# Patient Record
Sex: Female | Born: 1950 | Race: White | Hispanic: No | Marital: Married | Smoking: Never smoker
Health system: Southern US, Community
[De-identification: ages and names within clinical notes are randomized; demographics above are authoritative.]

## PROBLEM LIST (undated history)

## (undated) DIAGNOSIS — F419 Anxiety disorder, unspecified: Secondary | ICD-10-CM

## (undated) DIAGNOSIS — I10 Essential (primary) hypertension: Secondary | ICD-10-CM

## (undated) DIAGNOSIS — K219 Gastro-esophageal reflux disease without esophagitis: Secondary | ICD-10-CM

## (undated) DIAGNOSIS — N182 Chronic kidney disease, stage 2 (mild): Secondary | ICD-10-CM

## (undated) HISTORY — PX: CERVICAL FUSION: SHX112

---

## 2021-05-20 ENCOUNTER — Ambulatory Visit
Admission: EM | Admit: 2021-05-20 | Discharge: 2021-05-20 | Disposition: A | Discharge status: Home / Self Care | Payer: Medicare Other

## 2021-05-20 ENCOUNTER — Other Ambulatory Visit: Payer: Self-pay

## 2021-05-20 DIAGNOSIS — J0100 Acute maxillary sinusitis, unspecified: Secondary | ICD-10-CM | POA: Diagnosis not present

## 2021-05-20 HISTORY — DX: Essential (primary) hypertension: I10

## 2021-05-20 MED ORDER — IPRATROPIUM BROMIDE 0.06 % NA SOLN: 2.0000 | Freq: Four times a day (QID) | NASAL | 12 refills | Status: AC

## 2021-05-20 MED ORDER — AMOXICILLIN-POT CLAVULANATE 875-125 MG PO TABS: 1.0000 | ORAL_TABLET | Freq: Two times a day (BID) | ORAL | 0 refills | Status: AC

## 2021-05-20 MED ORDER — PREDNISONE 10 MG (21) PO TBPK: ORAL_TABLET | ORAL | 0 refills | Status: DC

## 2021-05-20 NOTE — Discharge Instructions (Signed)
The Augmentin twice daily with food for 14 days for treatment of your sinusitis.  Perform sinus irrigation 2-3 times a day with a NeilMed sinus rinse kit and distilled water.  Do not use tap water.  You can use plain over-the-counter Mucinex every 6 hours to break up the stickiness of the mucus so your body can clear it.  Increase your oral fluid intake to thin out your mucus so that is also able for your body to clear more easily.  Take an over-the-counter probiotic, such as Culturelle-align-activia, 1 hour after each dose of antibiotic to prevent diarrhea.  Take the Prednisone pack as directed by the package.  Use the Atrovent nasal spray, 2 squirts in each nostril every 6 hours as needed for nasal congestion.  If you develop any new or worsening symptoms return for reevaluation or see your primary care provider.

## 2021-05-20 NOTE — ED Triage Notes (Signed)
Pt c/o sinus congestion, facial pain for about 4 weeks. Pt has been treated with amoxicillin and flonase, when symptoms first started. Pt reports she also had an ear infection in her right ear. Pt states she did not improve with meds. Pt denies f/n/v/d or other symptoms.

## 2021-05-20 NOTE — ED Provider Notes (Signed)
[facility]    CSN: [id number] Arrival date & time: 05/20/21  1010      History   Chief Complaint Chief Complaint  Patient presents with   Nasal Congestion    HPI Shelia Wallace is a 70 y.o. female.   HPI  70 year old female here for evaluation of sinus congestion and facial pain.  Patient reports that she has been experiencing the above mentioned symptoms for the past 4 weeks.  [facility] [place] and was treated with amoxicillin and Flonase which did not resolve her symptoms.  She states that at that time she also had an ear infection right ear.  She states that she is continuing to feel pain in the right ear that goes down her jaw.  She states that the pain is most significant when she lays down at night and it makes it difficult for her to sleep.  She has not had a fever, pain in her upper teeth, or drainage from the ears.  She states that the pain is mostly in her maxillary sinuses, right ear, and down the right side of her neck.  She is experiencing postnasal drip and hoarseness.  She states that initially she had some nasal discharge but now she is not getting anything out of her nose.  In addition to the pain in her right ear she is also experiencing ringing in both ears.  This is not a new symptom.  Past Medical History:  Diagnosis Date   Hypertension     There are no problems to display for this patient.   History reviewed. No pertinent surgical history.  OB History   No obstetric history on file.      Home Medications    Prior to Admission medications   Medication Sig Start Date End Date Taking? Authorizing Provider  ADVAIR DISKUS 100-50 MCG/ACT AEPB SMARTSIG:1 unspecified By Mouth Twice Daily 04/30/21  Yes [provider]  albuterol (VENTOLIN HFA) 108 (90 Base) MCG/ACT inhaler Inhale into the lungs every 6 (six) hours as needed for wheezing or shortness of breath.   Yes [provider]   amoxicillin-clavulanate (AUGMENTIN) 875-125 MG tablet Take 1 tablet by mouth every 12 (twelve) hours for 14 days. 05/20/21 06/03/21 Yes Margarette Canada, NP  carvedilol (COREG) 6.25 MG tablet Take 6.25 mg by mouth 2 (two) times daily. 05/17/21  Yes [provider]  gabapentin (NEURONTIN) 300 MG capsule Take 300 mg by mouth every 12 (twelve) hours. 12/27/20  Yes [provider]  ipratropium (ATROVENT) 0.06 % nasal spray Place 2 sprays into both nostrils 4 (four) times daily. 05/20/21  Yes Margarette Canada, NP  losartan (COZAAR) 100 MG tablet Take 100 mg by mouth daily. 05/17/21  Yes [provider]  predniSONE (STERAPRED UNI-PAK 21 TAB) 10 MG (21) TBPK tablet Take 6 tablets on day 1, 5 tablets day 2, 4 tablets day 3, 3 tablets day 4, 2 tablets day 5, 1 tablet day 6 05/20/21  Yes Margarette Canada, NP    Family History History reviewed. No pertinent family history.  Social History Social History   Tobacco Use   Smoking status: Never   Smokeless tobacco: Never  Vaping Use   Vaping Use: Never used  Substance Use Topics   Alcohol use: Not Currently   Drug use: Never     Allergies   Patient has no known allergies.   Review of Systems Review of Systems  Constitutional:  Negative for activity change, appetite change  and fever.  HENT:  Positive for ear pain, sinus pressure and sinus pain. Negative for rhinorrhea and sore throat.   Respiratory:  Positive for cough. Negative for shortness of breath and wheezing.   Hematological: Negative.   Psychiatric/Behavioral: Negative.      Physical Exam Triage Vital Signs ED Triage Vitals [05/20/21 1144]  Enc Vitals Group     BP      Pulse      Resp      Temp      Temp src      SpO2      Weight 167 lb (75.8 kg)     Height 5' 6"  (1.676 m)     Head Circumference      Peak Flow      Pain Score 0     Pain Loc      Pain Edu?      Excl. in [place]?    No data found.  Updated Vital Signs BP (!) 156/76 (BP Location: Left Arm)    Pulse (!) 55   Temp 98.1 F (36.7 C) (Oral)   Resp 18   Ht 5' 6"  (1.676 m)   Wt 167 lb (75.8 kg)   SpO2 96%   BMI 26.95 kg/m   Visual Acuity Right Eye Distance:   Left Eye Distance:   Bilateral Distance:    Right Eye Near:   Left Eye Near:    Bilateral Near:     Physical Exam Vitals and nursing note reviewed.  Constitutional:      General: She is not in acute distress.    Appearance: Normal appearance. She is not ill-appearing.  HENT:     Head: Normocephalic and atraumatic.     Right Ear: Tympanic membrane, ear canal and external ear normal. There is no impacted cerumen.     Left Ear: Tympanic membrane, ear canal and external ear normal. There is no impacted cerumen.     Ears:     Comments: Patient does have pain with palpation of bilateral eustachian tubes externally.    Nose: Congestion and rhinorrhea present.     Comments: This mucosa is edematous with a bluish hue.  There is purulent discharge in both nares.  Patient does have tenderness to percussion of maxillary sinuses but not to frontal sinuses bilaterally.    Mouth/Throat:     Mouth: Mucous membranes are moist.     Pharynx: Oropharynx is clear. No posterior oropharyngeal erythema.  Cardiovascular:     Rate and Rhythm: Normal rate and regular rhythm.     Pulses: Normal pulses.     Heart sounds: Normal heart sounds. No murmur heard.   No gallop.  Pulmonary:     Effort: Pulmonary effort is normal.     Breath sounds: Normal breath sounds. No wheezing, rhonchi or rales.  Musculoskeletal:     Cervical back: Normal range of motion and neck supple.  Lymphadenopathy:     Cervical: Cervical adenopathy present.  Skin:    General: Skin is warm and dry.     Capillary Refill: Capillary refill takes less than 2 seconds.     Findings: No erythema or rash.  Neurological:     General: No focal deficit present.     Mental Status: She is alert and oriented to person, place, and time.  Psychiatric:        Mood and Affect:  Mood normal.        Behavior: Behavior normal.  Thought Content: Thought content normal.        Judgment: Judgment normal.     UC Treatments / Results  Labs (all labs ordered are listed, but only abnormal results are displayed) Labs Reviewed - No data to display  EKG   Radiology No results found.  Procedures Procedures (including critical care time)  Medications Ordered in UC Medications - No data to display  Initial Impression / Assessment and Plan / UC Course  I have reviewed the triage vital signs and the nursing notes.  Pertinent labs & imaging results that were available during my care of the patient were reviewed by me and considered in my medical decision making (see chart for details).  Is a nontoxic-appearing 70 year old female here for evaluation of 4 weeks worth of maxillary sinus pain, right ear pain, and pain on the right side of her neck.  She was previously treated with amoxicillin and Flonase without resolution of the symptoms.  She states that she is still using her Flonase but it does not seem to be helping.  Patient's physical exam reveals pearly gray tympanic membranes bilaterally with a normal light reflex and clear external auditory canals.  Both eustachian tubes are tender to palpation externally.  Nasal mucosa is markedly edematous and pale with a bluish hue.  There is purulent discharge in both nares.  Patient does have tenderness to percussion of bilateral maxillary sinuses.  Frontal sinuses are benign.  Oropharyngeal exam is benign.  Patient does have bilateral anterior cervical lymphadenopathy.  Cardiopulmonary exam reveals clear lung sounds in all fields.  Suspect patient has had nonresolution of maxillary sinusitis following her initial treatment.  We will place patient on Augmentin twice daily for 14 days.  I have also prescribed the patient a prednisone pack to help decrease inflammation and Atrovent nasal spray to help with nasal inflammation and  postnasal drip.  The patient and I discussed sinus irrigation using a NeilMed sinus rinse kit and distilled water as this may help evacuate the mucus that is retained within her maxillary sinuses.  I advised the patient that if her symptoms do not resolve after second round of antibiotics she needs to see an ear nose and throat specialist.   Final Clinical Impressions(s) / UC Diagnoses   Final diagnoses:  Acute non-recurrent maxillary sinusitis     Discharge Instructions      The Augmentin twice daily with food for 14 days for treatment of your sinusitis.  Perform sinus irrigation 2-3 times a day with a NeilMed sinus rinse kit and distilled water.  Do not use tap water.  You can use plain over-the-counter Mucinex every 6 hours to break up the stickiness of the mucus so your body can clear it.  Increase your oral fluid intake to thin out your mucus so that is also able for your body to clear more easily.  Take an over-the-counter probiotic, such as Culturelle-align-activia, 1 hour after each dose of antibiotic to prevent diarrhea.  Take the Prednisone pack as directed by the package.  Use the Atrovent nasal spray, 2 squirts in each nostril every 6 hours as needed for nasal congestion.  If you develop any new or worsening symptoms return for reevaluation or see your primary care provider.      ED Prescriptions     Medication Sig Dispense Auth. Provider   amoxicillin-clavulanate (AUGMENTIN) 875-125 MG tablet Take 1 tablet by mouth every 12 (twelve) hours for 14 days. 28 tablet Margarette Canada, NP   ipratropium (  ATROVENT) 0.06 % nasal spray Place 2 sprays into both nostrils 4 (four) times daily. 15 mL Margarette Canada, NP   predniSONE (STERAPRED UNI-PAK 21 TAB) 10 MG (21) TBPK tablet Take 6 tablets on day 1, 5 tablets day 2, 4 tablets day 3, 3 tablets day 4, 2 tablets day 5, 1 tablet day 6 21 tablet Margarette Canada, NP      PDMP not reviewed this encounter.   Margarette Canada, NP 05/20/21  1208

## 2021-09-01 ENCOUNTER — Other Ambulatory Visit: Payer: Self-pay

## 2021-09-01 ENCOUNTER — Emergency Department: Payer: Medicare Other

## 2021-09-01 ENCOUNTER — Encounter: Payer: Self-pay | Admitting: Emergency Medicine

## 2021-09-01 DIAGNOSIS — Z981 Arthrodesis status: Secondary | ICD-10-CM

## 2021-09-01 DIAGNOSIS — Z87891 Personal history of nicotine dependence: Secondary | ICD-10-CM

## 2021-09-01 DIAGNOSIS — K219 Gastro-esophageal reflux disease without esophagitis: Secondary | ICD-10-CM | POA: Diagnosis present

## 2021-09-01 DIAGNOSIS — Z801 Family history of malignant neoplasm of trachea, bronchus and lung: Secondary | ICD-10-CM

## 2021-09-01 DIAGNOSIS — F419 Anxiety disorder, unspecified: Secondary | ICD-10-CM | POA: Diagnosis present

## 2021-09-01 DIAGNOSIS — R042 Hemoptysis: Secondary | ICD-10-CM | POA: Diagnosis present

## 2021-09-01 DIAGNOSIS — Z79899 Other long term (current) drug therapy: Secondary | ICD-10-CM

## 2021-09-01 DIAGNOSIS — R7989 Other specified abnormal findings of blood chemistry: Secondary | ICD-10-CM | POA: Diagnosis not present

## 2021-09-01 DIAGNOSIS — J9601 Acute respiratory failure with hypoxia: Secondary | ICD-10-CM | POA: Diagnosis present

## 2021-09-01 DIAGNOSIS — Z8249 Family history of ischemic heart disease and other diseases of the circulatory system: Secondary | ICD-10-CM

## 2021-09-01 DIAGNOSIS — U071 COVID-19: Secondary | ICD-10-CM | POA: Diagnosis not present

## 2021-09-01 DIAGNOSIS — Z7951 Long term (current) use of inhaled steroids: Secondary | ICD-10-CM

## 2021-09-01 DIAGNOSIS — D539 Nutritional anemia, unspecified: Secondary | ICD-10-CM | POA: Diagnosis present

## 2021-09-01 DIAGNOSIS — N182 Chronic kidney disease, stage 2 (mild): Secondary | ICD-10-CM | POA: Diagnosis present

## 2021-09-01 DIAGNOSIS — R918 Other nonspecific abnormal finding of lung field: Secondary | ICD-10-CM | POA: Diagnosis present

## 2021-09-01 DIAGNOSIS — Z28310 Unvaccinated for covid-19: Secondary | ICD-10-CM

## 2021-09-01 DIAGNOSIS — I129 Hypertensive chronic kidney disease with stage 1 through stage 4 chronic kidney disease, or unspecified chronic kidney disease: Secondary | ICD-10-CM | POA: Diagnosis present

## 2021-09-01 LAB — RESP PANEL BY RT-PCR (FLU A&B, COVID) ARPGX2
Influenza A by PCR: NEGATIVE
Influenza B by PCR: NEGATIVE
SARS Coronavirus 2 by RT PCR: POSITIVE — AB

## 2021-09-01 LAB — BASIC METABOLIC PANEL WITH GFR
Anion gap: 5 (ref 5–15)
BUN: 18 mg/dL (ref 8–23)
CO2: 24 mmol/L (ref 22–32)
Calcium: 8.7 mg/dL — ABNORMAL LOW (ref 8.9–10.3)
Chloride: 102 mmol/L (ref 98–111)
Creatinine, Ser: 1.1 mg/dL — ABNORMAL HIGH (ref 0.44–1.00)
GFR, Estimated: 54 mL/min — ABNORMAL LOW
Glucose, Bld: 126 mg/dL — ABNORMAL HIGH (ref 70–99)
Potassium: 4.1 mmol/L (ref 3.5–5.1)
Sodium: 131 mmol/L — ABNORMAL LOW (ref 135–145)

## 2021-09-01 LAB — CBC
HCT: 29.1 % — ABNORMAL LOW (ref 36.0–46.0)
Hemoglobin: 9.6 g/dL — ABNORMAL LOW (ref 12.0–15.0)
MCH: 33.3 pg (ref 26.0–34.0)
MCHC: 33 g/dL (ref 30.0–36.0)
MCV: 101 fL — ABNORMAL HIGH (ref 80.0–100.0)
Platelets: 243 K/uL (ref 150–400)
RBC: 2.88 MIL/uL — ABNORMAL LOW (ref 3.87–5.11)
RDW: 13.5 % (ref 11.5–15.5)
WBC: 7.5 K/uL (ref 4.0–10.5)
nRBC: 0 % (ref 0.0–0.2)

## 2021-09-01 LAB — TROPONIN I (HIGH SENSITIVITY): Troponin I (High Sensitivity): 5 ng/L (ref <18)

## 2021-09-01 LAB — D-DIMER, QUANTITATIVE: D-Dimer, Quant: 0.76 ug/mL-FEU — ABNORMAL HIGH (ref 0.00–0.50)

## 2021-09-01 NOTE — ED Triage Notes (Signed)
Pt presents to ER c/o chest pain that radiates to back, sob and hematemesis.  Pt states she has been feeling sob since Thursday last week and today it became increasingly worse.  Pt states she has had pneumonia in past and this feels similar.  Pt A&O x4.  Per EDT Misty Stanley, pt was 84% on RA with sats improving on 2L.  Pt does not wear O2 at home.  Pt appears weak in triage.

## 2021-09-02 ENCOUNTER — Emergency Department: Payer: Medicare Other

## 2021-09-02 ENCOUNTER — Inpatient Hospital Stay
Admission: EM | Admit: 2021-09-02 | Discharge: 2021-09-04 | DRG: 177 | Disposition: A | Discharge status: Home / Self Care | Payer: Medicare Other | Attending: Internal Medicine | Admitting: Internal Medicine

## 2021-09-02 ENCOUNTER — Encounter: Payer: Self-pay | Admitting: Internal Medicine

## 2021-09-02 ENCOUNTER — Inpatient Hospital Stay: Payer: Medicare Other

## 2021-09-02 DIAGNOSIS — D649 Anemia, unspecified: Secondary | ICD-10-CM | POA: Diagnosis present

## 2021-09-02 DIAGNOSIS — D539 Nutritional anemia, unspecified: Secondary | ICD-10-CM | POA: Diagnosis present

## 2021-09-02 DIAGNOSIS — R7989 Other specified abnormal findings of blood chemistry: Secondary | ICD-10-CM

## 2021-09-02 DIAGNOSIS — R911 Solitary pulmonary nodule: Secondary | ICD-10-CM | POA: Diagnosis not present

## 2021-09-02 DIAGNOSIS — Z87891 Personal history of nicotine dependence: Secondary | ICD-10-CM | POA: Diagnosis not present

## 2021-09-02 DIAGNOSIS — I129 Hypertensive chronic kidney disease with stage 1 through stage 4 chronic kidney disease, or unspecified chronic kidney disease: Secondary | ICD-10-CM | POA: Diagnosis present

## 2021-09-02 DIAGNOSIS — I10 Essential (primary) hypertension: Secondary | ICD-10-CM | POA: Diagnosis not present

## 2021-09-02 DIAGNOSIS — F419 Anxiety disorder, unspecified: Secondary | ICD-10-CM

## 2021-09-02 DIAGNOSIS — Z28310 Unvaccinated for covid-19: Secondary | ICD-10-CM | POA: Diagnosis not present

## 2021-09-02 DIAGNOSIS — Z8249 Family history of ischemic heart disease and other diseases of the circulatory system: Secondary | ICD-10-CM | POA: Diagnosis not present

## 2021-09-02 DIAGNOSIS — N179 Acute kidney failure, unspecified: Secondary | ICD-10-CM

## 2021-09-02 DIAGNOSIS — K219 Gastro-esophageal reflux disease without esophagitis: Secondary | ICD-10-CM | POA: Diagnosis present

## 2021-09-02 DIAGNOSIS — U071 COVID-19: Principal | ICD-10-CM

## 2021-09-02 DIAGNOSIS — J9601 Acute respiratory failure with hypoxia: Secondary | ICD-10-CM | POA: Diagnosis present

## 2021-09-02 DIAGNOSIS — N182 Chronic kidney disease, stage 2 (mild): Secondary | ICD-10-CM | POA: Diagnosis present

## 2021-09-02 DIAGNOSIS — Z7951 Long term (current) use of inhaled steroids: Secondary | ICD-10-CM | POA: Diagnosis not present

## 2021-09-02 DIAGNOSIS — R918 Other nonspecific abnormal finding of lung field: Secondary | ICD-10-CM | POA: Diagnosis present

## 2021-09-02 DIAGNOSIS — Z981 Arthrodesis status: Secondary | ICD-10-CM | POA: Diagnosis not present

## 2021-09-02 DIAGNOSIS — Z79899 Other long term (current) drug therapy: Secondary | ICD-10-CM | POA: Diagnosis not present

## 2021-09-02 DIAGNOSIS — Z801 Family history of malignant neoplasm of trachea, bronchus and lung: Secondary | ICD-10-CM | POA: Diagnosis not present

## 2021-09-02 DIAGNOSIS — R042 Hemoptysis: Secondary | ICD-10-CM | POA: Diagnosis present

## 2021-09-02 HISTORY — DX: Gastro-esophageal reflux disease without esophagitis: K21.9

## 2021-09-02 HISTORY — DX: Chronic kidney disease, stage 2 (mild): N18.2

## 2021-09-02 HISTORY — DX: Anxiety disorder, unspecified: F41.9

## 2021-09-02 LAB — RETICULOCYTES
Immature Retic Fract: 21.9 % — ABNORMAL HIGH (ref 2.3–15.9)
RBC.: 2.97 MIL/uL — ABNORMAL LOW (ref 3.87–5.11)
Retic Count, Absolute: 140.5 10*3/uL (ref 19.0–186.0)
Retic Ct Pct: 4.7 % — ABNORMAL HIGH (ref 0.4–3.1)

## 2021-09-02 LAB — IRON AND TIBC
Iron: 27 ug/dL — ABNORMAL LOW (ref 28–170)
Saturation Ratios: 8 % — ABNORMAL LOW (ref 10.4–31.8)
TIBC: 332 ug/dL (ref 250–450)
UIBC: 305 ug/dL

## 2021-09-02 LAB — FERRITIN: Ferritin: 167 ng/mL (ref 11–307)

## 2021-09-02 LAB — TROPONIN I (HIGH SENSITIVITY): Troponin I (High Sensitivity): 6 ng/L (ref <18)

## 2021-09-02 LAB — C-REACTIVE PROTEIN: CRP: 1.7 mg/dL — ABNORMAL HIGH (ref <1.0)

## 2021-09-02 LAB — FOLATE: Folate: 9.8 ng/mL (ref >5.9)

## 2021-09-02 LAB — VITAMIN B12: Vitamin B-12: 1170 pg/mL — ABNORMAL HIGH (ref 180–914)

## 2021-09-02 MED ORDER — B COMPLEX-C PO TABS
1.0000 | ORAL_TABLET | Freq: Every day | ORAL | Status: DC
  Administered 2021-09-03 – 2021-09-04 (×2): 1 via ORAL
  Filled 2021-09-02 (×2): qty 1

## 2021-09-02 MED ORDER — ACETAMINOPHEN 325 MG PO TABS: 650.0000 mg | ORAL_TABLET | Freq: Four times a day (QID) | ORAL | Status: DC | PRN

## 2021-09-02 MED ORDER — LACTATED RINGERS IV BOLUS: 1000.0000 mL | Freq: Once | INTRAVENOUS | Status: DC

## 2021-09-02 MED ORDER — METHYLPREDNISOLONE SODIUM SUCC 40 MG IJ SOLR
40.0000 mg | Freq: Two times a day (BID) | INTRAMUSCULAR | Status: DC
  Administered 2021-09-02 – 2021-09-04 (×4): 40 mg via INTRAVENOUS
  Filled 2021-09-02 (×4): qty 1

## 2021-09-02 MED ORDER — IPRATROPIUM-ALBUTEROL 0.5-2.5 (3) MG/3ML IN SOLN
3.0000 mL | Freq: Once | RESPIRATORY_TRACT | Status: AC
  Administered 2021-09-02: 3 mL via RESPIRATORY_TRACT
  Filled 2021-09-02: qty 3

## 2021-09-02 MED ORDER — ALPRAZOLAM 0.25 MG PO TABS
0.2500 mg | ORAL_TABLET | Freq: Every day | ORAL | Status: DC
  Administered 2021-09-04: 0.25 mg via ORAL
  Filled 2021-09-02 (×3): qty 1

## 2021-09-02 MED ORDER — LOSARTAN POTASSIUM 50 MG PO TABS
100.0000 mg | ORAL_TABLET | Freq: Every day | ORAL | Status: DC
  Administered 2021-09-02 – 2021-09-04 (×3): 100 mg via ORAL
  Filled 2021-09-02 (×3): qty 2

## 2021-09-02 MED ORDER — DAPSONE 100 MG PO TABS
100.0000 mg | ORAL_TABLET | Freq: Every day | ORAL | Status: DC
  Administered 2021-09-02 – 2021-09-04 (×3): 100 mg via ORAL
  Filled 2021-09-02 (×3): qty 1

## 2021-09-02 MED ORDER — IPRATROPIUM-ALBUTEROL 0.5-2.5 (3) MG/3ML IN SOLN: 3.0000 mL | Freq: Once | RESPIRATORY_TRACT | Status: DC

## 2021-09-02 MED ORDER — FAMOTIDINE 20 MG PO TABS
20.0000 mg | ORAL_TABLET | Freq: Two times a day (BID) | ORAL | Status: DC
  Administered 2021-09-02 – 2021-09-04 (×4): 20 mg via ORAL
  Filled 2021-09-02 (×4): qty 1

## 2021-09-02 MED ORDER — ONDANSETRON HCL 4 MG/2ML IJ SOLN: 4.0000 mg | Freq: Three times a day (TID) | INTRAMUSCULAR | Status: DC | PRN

## 2021-09-02 MED ORDER — DM-GUAIFENESIN ER 30-600 MG PO TB12
1.0000 | ORAL_TABLET | Freq: Two times a day (BID) | ORAL | Status: DC | PRN
  Administered 2021-09-02 – 2021-09-04 (×3): 1 via ORAL
  Filled 2021-09-02 (×4): qty 1

## 2021-09-02 MED ORDER — IOHEXOL 350 MG/ML SOLN
75.0000 mL | Freq: Once | INTRAVENOUS | Status: AC | PRN
  Administered 2021-09-02: 75 mL via INTRAVENOUS
  Filled 2021-09-02: qty 75

## 2021-09-02 MED ORDER — METHYLPREDNISOLONE SODIUM SUCC 125 MG IJ SOLR
125.0000 mg | Freq: Once | INTRAMUSCULAR | Status: AC
  Administered 2021-09-02: 125 mg via INTRAVENOUS
  Filled 2021-09-02: qty 2

## 2021-09-02 MED ORDER — SODIUM CHLORIDE 0.9 % IV SOLN
100.0000 mg | Freq: Every day | INTRAVENOUS | Status: DC
  Administered 2021-09-03 – 2021-09-04 (×2): 100 mg via INTRAVENOUS
  Filled 2021-09-02: qty 100
  Filled 2021-09-02: qty 20

## 2021-09-02 MED ORDER — CARVEDILOL 6.25 MG PO TABS
6.2500 mg | ORAL_TABLET | Freq: Two times a day (BID) | ORAL | Status: DC
  Administered 2021-09-02 – 2021-09-04 (×4): 6.25 mg via ORAL
  Filled 2021-09-02 (×4): qty 1

## 2021-09-02 MED ORDER — SODIUM CHLORIDE 0.9 % IV SOLN
200.0000 mg | Freq: Once | INTRAVENOUS | Status: AC
  Administered 2021-09-02: 200 mg via INTRAVENOUS
  Filled 2021-09-02: qty 200

## 2021-09-02 MED ORDER — VITAMIN D3 25 MCG (1000 UNIT) PO TABS
5000.0000 [IU] | ORAL_TABLET | Freq: Every day | ORAL | Status: DC
  Administered 2021-09-03 – 2021-09-04 (×2): 5000 [IU] via ORAL
  Filled 2021-09-02 (×4): qty 5

## 2021-09-02 MED ORDER — IPRATROPIUM BROMIDE HFA 17 MCG/ACT IN AERS
2.0000 | INHALATION_SPRAY | RESPIRATORY_TRACT | Status: DC
  Administered 2021-09-02 – 2021-09-04 (×10): 2 via RESPIRATORY_TRACT
  Filled 2021-09-02: qty 12.9

## 2021-09-02 MED ORDER — ALPRAZOLAM 0.25 MG PO TABS: 0.2500 mg | ORAL_TABLET | Freq: Two times a day (BID) | ORAL | Status: DC | PRN

## 2021-09-02 MED ORDER — SODIUM CHLORIDE 0.9 % IV BOLUS
1000.0000 mL | Freq: Once | INTRAVENOUS | Status: AC
  Administered 2021-09-02: 1000 mL via INTRAVENOUS

## 2021-09-02 MED ORDER — BLACK ELDERBERRY 50 MG/5ML PO SYRP: ORAL_SOLUTION | Freq: Every day | ORAL | Status: DC

## 2021-09-02 MED ORDER — ALBUTEROL SULFATE HFA 108 (90 BASE) MCG/ACT IN AERS
2.0000 | INHALATION_SPRAY | RESPIRATORY_TRACT | Status: DC | PRN
  Filled 2021-09-02: qty 6.7

## 2021-09-02 NOTE — H&P (Signed)
History and Physical    Shelia Wallace [id number] DOB: [birth year] DOA: 09/02/2021  Referring MD/NP/PA:   PCP: Pcp, No   Patient coming from:  The patient is coming from home.  At baseline, pt is independent for most of ADL.        Chief Complaint: Fever, chills, cough, shortness breath  HPI: Shelia Wallace is a 71 y.o. female with medical history significant of hypertension, GERD, CKD-2, who presents with fever, chills, cough, shortness breath.  Patient states that she has been sick for about 1 week.  She has fever, chills, body aches, fatigue, cough with yellow-colored mucus production, shortness breath.  She also has chest tightness.  Her symptom has been progressively worsening.  Patient states that she coughed up streaks of blood once. She has nausea and vomited twice yesterday, which has resolved.  Currently patient does not have active nausea, vomiting, diarrhea or abdominal pain.  No symptoms of UTI.  Denies dark stool or rectal bleeding.  He is not vaccinated against COVID-19.  Patient is not using oxygen normally. Patient was found to have oxygen desaturation 84% on room air, which improved to 94% on 4 L oxygen.  ED Course: pt was found to have positive COVID-19 PCR, D-dimer 0.76, stable renal function, troponin level 5, 6, hemoglobin 9.6 (12.6 on 10/03/2018), temperature 99.2, blood pressure 174/76, heart rate 74, RR 25.  Chest x-ray negative for infiltration.  CT angiogram of chest is negative for PE, but showed pulmonary nodules.  Lower extremity Doppler negative for DVT.  Patient is admitted to [place] bed as inpatient  CT angiogram of chest:  1. Negative for acute pulmonary embolus. 2. Multiple pulmonary nodules are likely postinflammatory. Largest: 5 mm solid pulmonary nodule. No routine follow-up imaging is recommended per Fleischner Society Guidelines. These guidelines do not apply to immunocompromised patients and patients with cancer. Follow up in patients with  significant comorbidities as clinically warranted. For lung cancer screening, adhere to Lung-RADS guidelines. Reference: Radiology. 2017; 284(1):228-43. 3. Aortic Atherosclerosis (ICD10-I70.0  Review of Systems:   General: no fevers, chills, no body weight gain, has fatigue HEENT: no blurry vision, hearing changes or sore throat Respiratory: has dyspnea, coughing, no wheezing CV: has chest tightness, no palpitations GI: no nausea, vomiting, abdominal pain, diarrhea, constipation GU: no dysuria, burning on urination, increased urinary frequency, hematuria  Ext: no leg edema Neuro: no unilateral weakness, numbness, or tingling, no vision change or hearing loss Skin: no rash, no skin tear. MSK: No muscle spasm, no deformity, no limitation of range of movement in spin Heme: No easy bruising.  Travel history: No recent long distant travel.  Allergy: No Known Allergies  Past Medical History:  Diagnosis Date   Anxiety    CKD (chronic kidney disease), stage II    GERD (gastroesophageal reflux disease)    Hypertension     Past Surgical History:  Procedure Laterality Date   CERVICAL FUSION      Social History:  reports that she has never smoked. She has never used smokeless tobacco. She reports that she does not currently use alcohol. She reports that she does not use drugs.  Family History:  Family History  Problem Relation Age of Onset   Heart disease Father    Heart attack Sister    Lung cancer Brother      Prior to Admission medications   Medication Sig Start Date End Date Taking? Authorizing Provider  ADVAIR DISKUS 100-50 MCG/ACT AEPB SMARTSIG:1 unspecified By Mouth Twice Daily 04/30/21  [provider]  albuterol (VENTOLIN HFA) 108 (90 Base) MCG/ACT inhaler Inhale into the lungs every 6 (six) hours as needed for wheezing or shortness of breath.    [provider]  carvedilol (COREG) 6.25 MG tablet Take 6.25 mg by mouth 2 (two) times daily. 05/17/21    [provider]  gabapentin (NEURONTIN) 300 MG capsule Take 300 mg by mouth every 12 (twelve) hours. 12/27/20   [provider]  ipratropium (ATROVENT) 0.06 % nasal spray Place 2 sprays into both nostrils 4 (four) times daily. 05/20/21   Margarette Canada, NP  losartan (COZAAR) 100 MG tablet Take 100 mg by mouth daily. 05/17/21   [provider]  predniSONE (STERAPRED UNI-PAK 21 TAB) 10 MG (21) TBPK tablet Take 6 tablets on day 1, 5 tablets day 2, 4 tablets day 3, 3 tablets day 4, 2 tablets day 5, 1 tablet day 6 05/20/21   Margarette Canada, NP    Physical Exam: Vitals:   09/02/21 1217 09/02/21 1225 09/02/21 1339 09/02/21 1439  BP:  130/87 140/71 130/62  Pulse: 69 69 70 70  Resp:   16 19  Temp:    98.7 F (37.1 C)  TempSrc:    Oral  SpO2: 94% 94% 95% 97%  Weight:      Height:       General: Not in acute distress HEENT:       Eyes: PERRL, EOMI, no scleral icterus.       ENT: No discharge from the ears and nose, no pharynx injection, no tonsillar enlargement.        Neck: No JVD, no bruit, no mass felt. Heme: No neck lymph node enlargement. Cardiac: S1/S2, RRR, No murmurs, No gallops or rubs. Respiratory: Has coarse breathing sound bilaterally GI: Soft, nondistended, nontender, no rebound pain, no organomegaly, BS present. GU: No hematuria Ext: No pitting leg edema bilaterally. 1+DP/PT pulse bilaterally. Musculoskeletal: No joint deformities, No joint redness or warmth, no limitation of ROM in spin. Skin: No rashes.  Neuro: Alert, oriented X3, cranial nerves II-XII grossly intact, moves all extremities normally. Psych: Patient is not psychotic, no suicidal or hemocidal ideation.  Labs on Admission: I have personally reviewed following labs and imaging studies  CBC: Recent Labs  Lab 09/01/21 2105  WBC 7.5  HGB 9.6*  HCT 29.1*  MCV 101.0*  PLT 0000000   Basic Metabolic Panel: Recent Labs  Lab 09/01/21 2105  NA 131*  K 4.1  CL 102  CO2 24  GLUCOSE 126*   BUN 18  CREATININE 1.10*  CALCIUM 8.7*   GFR: Estimated Creatinine Clearance: 50.9 mL/min (A) (by C-G formula based on SCr of 1.1 mg/dL (H)). Liver Function Tests: No results for input(s): AST, ALT, ALKPHOS, BILITOT, PROT, ALBUMIN in the last 168 hours. No results for input(s): LIPASE, AMYLASE in the last 168 hours. No results for input(s): AMMONIA in the last 168 hours. Coagulation Profile: No results for input(s): INR, PROTIME in the last 168 hours. Cardiac Enzymes: No results for input(s): CKTOTAL, CKMB, CKMBINDEX, TROPONINI in the last 168 hours. BNP (last 3 results) No results for input(s): PROBNP in the last 8760 hours. HbA1C: No results for input(s): HGBA1C in the last 72 hours. CBG: No results for input(s): GLUCAP in the last 168 hours. Lipid Profile: No results for input(s): CHOL, HDL, LDLCALC, TRIG, CHOLHDL, LDLDIRECT in the last 72 hours. Thyroid Function Tests: No results for input(s): TSH, T4TOTAL, FREET4, T3FREE, THYROIDAB in the last 72 hours. Anemia Panel: Recent  Labs    09/01/21 2105 09/01/21 2326  VITAMINB12 1,170*  --   FOLATE  --  9.8  FERRITIN  --  167  TIBC  --  332  IRON  --  27*  RETICCTPCT 4.7*  --    Urine analysis: No results found for: COLORURINE, APPEARANCEUR, LABSPEC, PHURINE, GLUCOSEU, HGBUR, BILIRUBINUR, KETONESUR, PROTEINUR, UROBILINOGEN, NITRITE, LEUKOCYTESUR Sepsis Labs: @LABRCNTIP (procalcitonin:4,lacticidven:4) ) Recent Results (from the past 240 hour(s))  Resp Panel by RT-PCR (Flu A&B, Covid) Nasopharyngeal Swab     Status: Abnormal   Collection Time: 09/01/21  9:05 PM   Specimen: Nasopharyngeal Swab; Nasopharyngeal(NP) swabs in vial transport medium  Result Value Ref Range Status   SARS Coronavirus 2 by RT PCR POSITIVE (A) NEGATIVE Final    Comment: (NOTE) SARS-CoV-2 target nucleic acids are DETECTED.  The SARS-CoV-2 RNA is generally detectable in upper respiratory specimens during the acute phase of infection. Positive  results are indicative of the presence of the identified virus, but do not rule out bacterial infection or co-infection with other pathogens not detected by the test. Clinical correlation with patient history and other diagnostic information is necessary to determine patient infection status. The expected result is Negative.  Fact Sheet for Patients: EntrepreneurPulse.com.au  Fact Sheet for Healthcare Providers: IncredibleEmployment.be  This test is not yet approved or cleared by the [place] FDA and  has been authorized for detection and/or diagnosis of SARS-CoV-2 by FDA under an Emergency Use Authorization (EUA).  This EUA will remain in effect (meaning this test can be used) for the duration of  the COVID-19 declaration under Section 564(b)(1) of the A ct, 21 U.S.C. section 360bbb-3(b)(1), unless the authorization is terminated or revoked sooner.     Influenza A by PCR NEGATIVE NEGATIVE Final   Influenza B by PCR NEGATIVE NEGATIVE Final    Comment: (NOTE) The Xpert Xpress SARS-CoV-2/FLU/RSV plus assay is intended as an aid in the diagnosis of influenza from Nasopharyngeal swab specimens and should not be used as a sole basis for treatment. Nasal washings and aspirates are unacceptable for Xpert Xpress SARS-CoV-2/FLU/RSV testing.  Fact Sheet for Patients: EntrepreneurPulse.com.au  Fact Sheet for Healthcare Providers: IncredibleEmployment.be  This test is not yet approved or cleared by the [place] FDA and has been authorized for detection and/or diagnosis of SARS-CoV-2 by FDA under an Emergency Use Authorization (EUA). This EUA will remain in effect (meaning this test can be used) for the duration of the COVID-19 declaration under Section 564(b)(1) of the Act, 21 U.S.C. section 360bbb-3(b)(1), unless the authorization is terminated or revoked.  Performed at [facility], [address] [place], [place] [address]      Radiological Exams on Admission: DG Chest 2 View  Result Date: 09/01/2021 CLINICAL DATA:  Chest pain radiating to back, short of breath, hematemesis EXAM: CHEST - 2 VIEW COMPARISON:  None. FINDINGS: Frontal and lateral views of the chest demonstrate an unremarkable cardiac silhouette. No acute airspace disease, effusion, or pneumothorax. No acute bony abnormalities. IMPRESSION: 1. No acute intrathoracic process. Electronically Signed   By: Randa Ngo M.D.   On: 09/01/2021 21:55   CT Angio Chest PE W and/or Wo Contrast  Result Date: 09/02/2021 CLINICAL DATA:  71 year old female with chest pain radiating to the back, shortness of breath and hematemesis. EXAM: CT ANGIOGRAPHY CHEST WITH CONTRAST TECHNIQUE: Multidetector CT imaging of the chest was performed using the standard protocol during bolus administration of intravenous contrast. Multiplanar CT image reconstructions and MIPs were obtained to  evaluate the vascular anatomy. CONTRAST:  36mL OMNIPAQUE IOHEXOL 350 MG/ML SOLN COMPARISON:  Chest radiographs 09/01/2021. FINDINGS: Cardiovascular: Good contrast bolus timing in the pulmonary arterial tree. No focal filling defect identified in the pulmonary arteries to suggest acute pulmonary embolism. Calcified atherosclerosis of the descending thoracic aorta which otherwise appears negative. No definite calcified coronary artery plaque. Mediastinum/Nodes: Negative.  No mediastinal lymphadenopathy. Lungs/Pleura: Major airways are patent. Relatively normal lung volumes. Minimal dependent atelectasis. There are two small right middle lobe pulmonary nodules along the minor fissure. A subpleural nodule is calcified on series 6, image 41 compatible with small granuloma. 5 mm solid but noncalcified nodule on series 6, image 45 and series 10, image 55. superimposed mild scarring in both the right middle lobe and lung apices. Upper Abdomen: Negative visible liver, spleen,  pancreas, adrenal glands and bowel in the upper abdomen. Musculoskeletal: There are several chronic left lateral rib fractures. No acute osseous abnormality identified. Review of the MIP images confirms the above findings. IMPRESSION: 1. Negative for acute pulmonary embolus. 2. Multiple pulmonary nodules are likely postinflammatory. Largest: 5 mm solid pulmonary nodule. No routine follow-up imaging is recommended per Fleischner Society Guidelines. These guidelines do not apply to immunocompromised patients and patients with cancer. Follow up in patients with significant comorbidities as clinically warranted. For lung cancer screening, adhere to Lung-RADS guidelines. Reference: Radiology. 2017; 284(1):228-43. 3. Aortic Atherosclerosis (ICD10-I70.0). Electronically Signed   By: Genevie Ann M.D.   On: 09/02/2021 04:56   US Venous Img Lower Bilateral (DVT)  Result Date: 09/02/2021 CLINICAL DATA:  Positive D-dimer, COVID positive EXAM: BILATERAL LOWER EXTREMITY VENOUS DOPPLER ULTRASOUND TECHNIQUE: Gray-scale sonography with graded compression, as well as color Doppler and duplex ultrasound were performed to evaluate the lower extremity deep venous systems from the level of the common femoral vein and including the common femoral, femoral, profunda femoral, popliteal and calf veins including the posterior tibial, peroneal and gastrocnemius veins when visible. The superficial great saphenous vein was also interrogated. Spectral Doppler was utilized to evaluate flow at rest and with distal augmentation maneuvers in the common femoral, femoral and popliteal veins. COMPARISON:  None. FINDINGS: RIGHT LOWER EXTREMITY Common Femoral Vein: No evidence of thrombus. Normal compressibility, respiratory phasicity and response to augmentation. Saphenofemoral Junction: No evidence of thrombus. Normal compressibility and flow on color Doppler imaging. Profunda Femoral Vein: No evidence of thrombus. Normal compressibility and flow on  color Doppler imaging. Femoral Vein: No evidence of thrombus. Normal compressibility, respiratory phasicity and response to augmentation. Popliteal Vein: No evidence of thrombus. Normal compressibility, respiratory phasicity and response to augmentation. Calf Veins: No evidence of thrombus. Normal compressibility and flow on color Doppler imaging. Superficial Great Saphenous Vein: No evidence of thrombus. Normal compressibility. Venous Reflux:  None. Other Findings:  None. LEFT LOWER EXTREMITY Common Femoral Vein: No evidence of thrombus. Normal compressibility, respiratory phasicity and response to augmentation. Saphenofemoral Junction: No evidence of thrombus. Normal compressibility and flow on color Doppler imaging. Profunda Femoral Vein: No evidence of thrombus. Normal compressibility and flow on color Doppler imaging. Femoral Vein: No evidence of thrombus. Normal compressibility, respiratory phasicity and response to augmentation. Popliteal Vein: No evidence of thrombus. Normal compressibility, respiratory phasicity and response to augmentation. Calf Veins: No evidence of thrombus. Normal compressibility and flow on color Doppler imaging. Superficial Great Saphenous Vein: No evidence of thrombus. Normal compressibility. Venous Reflux:  None. Other Findings:  None. IMPRESSION: No evidence of deep venous thrombosis in either lower extremity. Electronically Signed   By: Myrle Sheng  Laurence Ferrari M.D.   On: 09/02/2021 10:34     EKG: I have personally reviewed.  Sinus rhythm, QTC 447, LAD, poor R wave progression  Assessment/Plan Principal Problem:   Acute hypoxemic respiratory failure due to COVID-19 [facility]) Active Problems:   Hypertension   Normocytic anemia   Lung nodule   CKD (chronic kidney disease), stage II   Anxiety   Acute hypoxemic respiratory failure due to COVID-19 [facility]): Chest x-ray negative.  Patient has new 4L oxygen requirement -will admit to med-surg bed as inpt -Remdesivir per  pharm -Solumedrol 40 mg bid -Bronchodilators -PRN Mucinex for cough -Gentle IV fluid: 1L NS in ED -f/u inflammatory marker, CRP -Will ask the patient to maintain an awake prone position for 16+ hours a day, if possible, with a minimum of 2-3 hours at a time -Will attempt to maintain euvolemia to a net negative fluid status -IF patient deteriorates, will consult PCCM and ID  Hypertension -IV hydralazine as needed -Coreg Cozaar  CKD-II: Her baseline creatinine 1.09 07/11/2021 (his husband showed showed me her lab on his phone) -f/u with BMP  Anxiety: -home Xanax 0.25 mg daily  Normocytic anemia: Hemoglobin 9.6.  Patient had a hemoglobin 12.6 on 10/03/2020.  Denies any rectal bleeding or dark stool.  Etiology is not clear for hemoglobin drop. -Check anemia panel  Lung nodule: CTA incidentally found multiple pulmonary nodules are likely postinflammatory. Largest 5 mm solid pulmonary nodule -f/u with PCP    DVT ppx: SCD Code Status: Full code Family Communication:  Yes, patient's husband at bed side.   Disposition Plan:  Anticipate discharge back to previous environment Consults called:  none Admission status and Level of care: Telemetry Medical: as inpt       Status is: Inpatient  Remains inpatient appropriate because: Patient has some chronic comorbidities, now presents with acute hypoxia respiratory failure due to COVID-19 infection.  Patient has a new 4L of oxygen requirement.  Also has worsening anemia.  Her presentation is highly complicated.  Patient is at high risk of deteriorating.  Need to be treated in the hospital for at least 2 days.          Date of Service 09/02/2021    Ivor Costa [facility]   If 7PM-7AM, please contact night-coverage [email or web address] 09/02/2021, 3:07 PM

## 2021-09-02 NOTE — ED Notes (Addendum)
3rd msg sent to Neville Route, RN to see if pt is able to go to inpatient room, as room is clean & ready now.

## 2021-09-02 NOTE — ED Notes (Signed)
Secure msg sent to Neville Route, RN for ED to [facility].

## 2021-09-02 NOTE — Plan of Care (Signed)

## 2021-09-02 NOTE — TOC Initial Note (Signed)
Transition of Care [facility]) - Initial/Assessment Note    Patient Details  Name: Shelia Wallace MRN: [id number] Date of Birth: Dec 22, 1950  Transition of Care [facility]) CM/SW Contact:    Beverly Sessions, RN Phone Number: 09/02/2021, 2:41 PM  Clinical Narrative:                  Transition of Care [facility]) Screening Note   Patient Details  Name: Shelia Wallace Date of Birth: Jul 23, 1951   Transition of Care [facility]) CM/SW Contact:    Beverly Sessions, RN Phone Number: 09/02/2021, 2:43 PM    Transition of Care Department [facility]) has reviewed patient and no TOC needs have been identified at this time. We will continue to monitor patient advancement through interdisciplinary progression rounds. If new patient transition needs arise, please place a TOC consult.          Patient Goals and CMS Choice        Expected Discharge Plan and Services                                                Prior Living Arrangements/Services                       Activities of Daily Living      Permission Sought/Granted                  Emotional Assessment              Admission diagnosis:  Positive D-dimer [R79.89] Acute respiratory failure with hypoxia (HCC) [J96.01] Anemia, unspecified type [D64.9] Acute hypoxemic respiratory failure due to COVID-19 ([place]) [U07.1, J96.01] COVID-19 [U07.1] Patient Active Problem List   Diagnosis Date Noted   Acute hypoxemic respiratory failure due to COVID-19 ([place]) 09/02/2021   Hypertension    AKI (acute kidney injury) ([place])    Normocytic anemia    Lung nodule    PCP:  Pcp, No Pharmacy:   [id number] - [place], [place] [address] Phone: [phone] Fax: [phone]     Social Determinants of Health (SDOH) Interventions    Readmission Risk Interventions No flowsheet data found.

## 2021-09-02 NOTE — ED Notes (Signed)
Echo at bedside at this time

## 2021-09-02 NOTE — ED Notes (Signed)
Pt given her lunch tray.

## 2021-09-02 NOTE — ED Provider Notes (Signed)
[facility] Provider Note    Event Date/Time   First MD Initiated Contact with Patient 09/02/21 [id number]     (approximate)   History   Chest Pain and Shortness of Breath   HPI  Shelia Wallace is a 71 y.o. female with a history of hypertension and former smoker who presents for evaluation of chest pain and shortness of breath.  Patient reports 4 days of productive cough, body aches, progressively worsening shortness of breath, chills, fever, chest pain.  She reports that her symptoms were similar to when she had pneumonia several years ago.  She denies any history of COPD, asthma or emphysema.  She describes her chest pain as tightness and denies pleuritic chest pain, personal or family history of PE or DVT, recent travel immobilization, leg pain or swelling, or exogenous hormones.  She does report 1 episode of hemoptysis earlier today.     Past Medical History:  Diagnosis Date   Hypertension     History reviewed. No pertinent surgical history.   Physical Exam   Triage Vital Signs: ED Triage Vitals  Enc Vitals Group     BP 09/01/21 2051 (!) 158/81     Pulse Rate 09/01/21 2051 74     Resp 09/01/21 2051 (!) 25     Temp 09/01/21 2051 99.2 F (37.3 C)     Temp Source 09/01/21 2051 Oral     SpO2 09/01/21 2051 (!) 84 %     Weight 09/01/21 2050 172 lb (78 kg)     Height 09/01/21 2050 5\' 6"  (1.676 m)     Head Circumference --      Peak Flow --      Pain Score 09/01/21 2112 8     Pain Loc --      Pain Edu? --      Excl. in [place]? --     Most recent vital signs: Vitals:   09/02/21 0025 09/02/21 0403  BP: (!) 148/68 (!) 174/76  Pulse: 72 64  Resp: 18 20  Temp: 98.7 F (37.1 C)   SpO2: 95% 94%     General: Awake, mild respiratory distress CV:  RRR, no murmurs, strong pulses x 4 Resp:  Increased work of breathing, tachypneic, hypoxic to 80%, wheezing bilaterally Abd:  Soft, non tender, non distended, positive bowel sounds, no rebound or  guarding MSK:  No edema or cyanosis Neuro:  Normal speech, face is symmetric, moving all extremities with no gross focal neuro deficit   ED Results / Procedures / Treatments   Labs (all labs ordered are listed, but only abnormal results are displayed) Labs Reviewed  RESP PANEL BY RT-PCR (FLU A&B, COVID) ARPGX2 - Abnormal; Notable for the following components:      Result Value   SARS Coronavirus 2 by RT PCR POSITIVE (*)    All other components within normal limits  BASIC METABOLIC PANEL - Abnormal; Notable for the following components:   Sodium 131 (*)    Glucose, Bld 126 (*)    Creatinine, Ser 1.10 (*)    Calcium 8.7 (*)    GFR, Estimated 54 (*)    All other components within normal limits  CBC - Abnormal; Notable for the following components:   RBC 2.88 (*)    Hemoglobin 9.6 (*)    HCT 29.1 (*)    MCV 101.0 (*)    All other components within normal limits  D-DIMER, QUANTITATIVE - Abnormal; Notable for the following components:   D-Dimer,  Quant 0.76 (*)    All other components within normal limits  TROPONIN I (HIGH SENSITIVITY)  TROPONIN I (HIGH SENSITIVITY)     EKG  ED ECG REPORT I, Rudene Re, the attending physician, personally viewed and interpreted this ECG.  Normal sinus rhythm, rate of 73, no ST elevations or depressions.   RADIOLOGY I have personally reviewed the images performed during this visit and I agree with the Radiologist's read.   Interpretation by Radiologist:  DG Chest 2 View  Result Date: 09/01/2021 CLINICAL DATA:  Chest pain radiating to back, short of breath, hematemesis EXAM: CHEST - 2 VIEW COMPARISON:  None. FINDINGS: Frontal and lateral views of the chest demonstrate an unremarkable cardiac silhouette. No acute airspace disease, effusion, or pneumothorax. No acute bony abnormalities. IMPRESSION: 1. No acute intrathoracic process. Electronically Signed   By: Randa Ngo M.D.   On: 09/01/2021 21:55   CT Angio Chest PE W and/or Wo  Contrast  Result Date: 09/02/2021 CLINICAL DATA:  71 year old female with chest pain radiating to the back, shortness of breath and hematemesis. EXAM: CT ANGIOGRAPHY CHEST WITH CONTRAST TECHNIQUE: Multidetector CT imaging of the chest was performed using the standard protocol during bolus administration of intravenous contrast. Multiplanar CT image reconstructions and MIPs were obtained to evaluate the vascular anatomy. CONTRAST:  73mL OMNIPAQUE IOHEXOL 350 MG/ML SOLN COMPARISON:  Chest radiographs 09/01/2021. FINDINGS: Cardiovascular: Good contrast bolus timing in the pulmonary arterial tree. No focal filling defect identified in the pulmonary arteries to suggest acute pulmonary embolism. Calcified atherosclerosis of the descending thoracic aorta which otherwise appears negative. No definite calcified coronary artery plaque. Mediastinum/Nodes: Negative.  No mediastinal lymphadenopathy. Lungs/Pleura: Major airways are patent. Relatively normal lung volumes. Minimal dependent atelectasis. There are two small right middle lobe pulmonary nodules along the minor fissure. A subpleural nodule is calcified on series 6, image 41 compatible with small granuloma. 5 mm solid but noncalcified nodule on series 6, image 45 and series 10, image 55. superimposed mild scarring in both the right middle lobe and lung apices. Upper Abdomen: Negative visible liver, spleen, pancreas, adrenal glands and bowel in the upper abdomen. Musculoskeletal: There are several chronic left lateral rib fractures. No acute osseous abnormality identified. Review of the MIP images confirms the above findings. IMPRESSION: 1. Negative for acute pulmonary embolus. 2. Multiple pulmonary nodules are likely postinflammatory. Largest: 5 mm solid pulmonary nodule. No routine follow-up imaging is recommended per Fleischner Society Guidelines. These guidelines do not apply to immunocompromised patients and patients with cancer. Follow up in patients with  significant comorbidities as clinically warranted. For lung cancer screening, adhere to Lung-RADS guidelines. Reference: Radiology. 2017; 284(1):228-43. 3. Aortic Atherosclerosis (ICD10-I70.0). Electronically Signed   By: Genevie Ann M.D.   On: 09/02/2021 04:56      PROCEDURES:  Critical Care performed: Yes, see critical care procedure note(s)  .Critical Care Performed by: Rudene Re, MD Authorized by: Rudene Re, MD   Critical care provider statement:    Critical care time (minutes):  40   Critical care was necessary to treat or prevent imminent or life-threatening deterioration of the following conditions:  Cardiac failure, respiratory failure, sepsis, circulatory failure, shock and metabolic crisis   Critical care was time spent personally by me on the following activities:  Development of treatment plan with patient or surrogate, discussions with consultants, evaluation of patient's response to treatment, examination of patient, ordering and review of laboratory studies, ordering and review of radiographic studies, ordering and performing treatments and interventions, pulse  oximetry, re-evaluation of patient's condition, review of old charts and obtaining history from patient or surrogate   I assumed direction of critical care for this patient from another provider in my specialty: no     Care discussed with: admitting provider      IMPRESSION / MDM / [place] / ED COURSE  I reviewed the triage vital signs and the nursing notes.  71 y.o. female with a history of hypertension and former smoker who presents for evaluation of chest pain and shortness of breath in the setting of 4 days of cough and congestion.  1 episode of hemoptysis.  On exam patient is in mild respiratory distress, hypoxic with sats in the 80s on room air, wheezing bilaterally.  Extremities look symmetric with no edema.  Ddx: COPD, flu, COVID, pneumonia, PE, new CHF, myocarditis,  pericarditis   Plan: Chest x-ray, CBC, BMP, troponin, COVID and flu swab, EKG.  Solu-Medrol and DuoNeb's x2.  Patient placed on telemetry for close monitoring of cardiorespiratory status.  Patient on 5 L nasal cannula   MEDICATIONS GIVEN IN ED: Medications  remdesivir 100 mg in sodium chloride 0.9 % 100 mL IVPB (has no administration in time range)  ipratropium-albuterol (DUONEB) 0.5-2.5 (3) MG/3ML nebulizer solution 3 mL (3 mLs Nebulization Given 09/02/21 0411)  methylPREDNISolone sodium succinate (SOLU-MEDROL) 125 mg/2 mL injection 125 mg (125 mg Intravenous Given 09/02/21 0406)  remdesivir 200 mg in sodium chloride 0.9% 250 mL IVPB (0 mg Intravenous Stopped 09/02/21 0507)  ipratropium-albuterol (DUONEB) 0.5-2.5 (3) MG/3ML nebulizer solution 3 mL (3 mLs Nebulization Given 09/02/21 0408)  sodium chloride 0.9 % bolus 1,000 mL (1,000 mLs Intravenous New Bag/Given 09/02/21 0412)  iohexol (OMNIPAQUE) 350 MG/ML injection 75 mL (75 mLs Intravenous Contrast Given 09/02/21 0435)     ED COURSE: Positive for COVID.  Chest x-ray visualized by me with no signs of pneumonia, confirmed per radiology.  EKG and troponin with no signs of ischemia, peri-/myocarditis.  Patient with labs showing anemia with a hemoglobin of 9.6.  Patient denies any prior history of anemia.  She endorses 1 episode of hemoptysis and describes a small amount of blood.  Denies any other bleeding.  She is not on blood thinners.  No melena, hematemesis or hematochezia.  Slightly elevated creatinine and blood glucose of 126 with no evidence of DKA.  Patient denies history of kidney disease or diabetes.  D-dimer was elevated therefore she was sent for CT angio.  CT visualized by me showing no signs of PE or PNA, confirmed by radiology. Patient's respiratory status improved after breathing treatment, she remains on 5 L nasal cannula and now stable for admission to the hospitalist service for   Consults: Hospitalist service was consulted and after  discussion of the case accepted patient for admission   EMR reviewed patient's care is mostly from [place] with no records available.  Did review her records from patient's my chart via her cellphone.          FINAL CLINICAL IMPRESSION(S) / ED DIAGNOSES   Final diagnoses:  Acute respiratory failure with hypoxia (HCC)  COVID-19  Anemia, unspecified type     Rx / DC Orders   ED Discharge Orders     None        Note:  This document was prepared using Dragon voice recognition software and may include unintentional dictation errors.    Alfred Levins, [place], MD 09/02/21 [phone]

## 2021-09-02 NOTE — Consult Note (Signed)
Remdesivir - Pharmacy Brief Note   O:  ALT: pending SpO2: 95% on 2 L    A/P:  71 y/o female presented with worsening SOB.Was 84% on RA with sats improving on 2L.Remdesivir 200 mg IVPB once followed by 100 mg IVPB daily x 4 days.   Sharen Hones, PharmD, BCPS Clinical Pharmacist   09/02/2021 3:49 AM

## 2021-09-02 NOTE — Progress Notes (Signed)
Patient admitted to room 206 from ED d/t SOB, fever and chills due to Covid 19. Patient A+Ox4. On 4 L of O2 therapy via [place] with VSS. Assessment completed. Bed locked and set to lowest position.  Needs and call bell at reach. Will continue to monitor and assess with plan of care.

## 2021-09-03 LAB — CBC WITH DIFFERENTIAL/PLATELET
Abs Immature Granulocytes: 0.05 10*3/uL (ref 0.00–0.07)
Basophils Absolute: 0 10*3/uL (ref 0.0–0.1)
Basophils Relative: 0 %
Eosinophils Absolute: 0 10*3/uL (ref 0.0–0.5)
Eosinophils Relative: 0 %
HCT: 28 % — ABNORMAL LOW (ref 36.0–46.0)
Hemoglobin: 9.1 g/dL — ABNORMAL LOW (ref 12.0–15.0)
Immature Granulocytes: 1 %
Lymphocytes Relative: 7 %
Lymphs Abs: 0.6 10*3/uL — ABNORMAL LOW (ref 0.7–4.0)
MCH: 33.6 pg (ref 26.0–34.0)
MCHC: 32.5 g/dL (ref 30.0–36.0)
MCV: 103.3 fL — ABNORMAL HIGH (ref 80.0–100.0)
Monocytes Absolute: 0.5 10*3/uL (ref 0.1–1.0)
Monocytes Relative: 6 %
Neutro Abs: 7.8 10*3/uL — ABNORMAL HIGH (ref 1.7–7.7)
Neutrophils Relative %: 86 %
Platelets: 252 10*3/uL (ref 150–400)
RBC: 2.71 MIL/uL — ABNORMAL LOW (ref 3.87–5.11)
RDW: 13.5 % (ref 11.5–15.5)
WBC: 8.9 10*3/uL (ref 4.0–10.5)
nRBC: 0 % (ref 0.0–0.2)

## 2021-09-03 LAB — COMPREHENSIVE METABOLIC PANEL
ALT: 22 U/L (ref 0–44)
AST: 22 U/L (ref 15–41)
Albumin: 3.6 g/dL (ref 3.5–5.0)
Alkaline Phosphatase: 71 U/L (ref 38–126)
Anion gap: 7 (ref 5–15)
BUN: 23 mg/dL (ref 8–23)
CO2: 26 mmol/L (ref 22–32)
Calcium: 8.9 mg/dL (ref 8.9–10.3)
Chloride: 104 mmol/L (ref 98–111)
Creatinine, Ser: 0.82 mg/dL (ref 0.44–1.00)
GFR, Estimated: >60 mL/min (ref >60)
Glucose, Bld: 158 mg/dL — ABNORMAL HIGH (ref 70–99)
Potassium: 4.5 mmol/L (ref 3.5–5.1)
Sodium: 137 mmol/L (ref 135–145)
Total Bilirubin: 0.9 mg/dL (ref 0.3–1.2)
Total Protein: 6.2 g/dL — ABNORMAL LOW (ref 6.5–8.1)

## 2021-09-03 LAB — C-REACTIVE PROTEIN: CRP: 2.6 mg/dL — ABNORMAL HIGH (ref <1.0)

## 2021-09-03 LAB — HIV ANTIBODY (ROUTINE TESTING W REFLEX): HIV Screen 4th Generation wRfx: NONREACTIVE

## 2021-09-03 MED ORDER — HYDRALAZINE HCL 10 MG PO TABS
10.0000 mg | ORAL_TABLET | Freq: Three times a day (TID) | ORAL | Status: DC
  Administered 2021-09-03 – 2021-09-04 (×3): 10 mg via ORAL
  Filled 2021-09-03 (×5): qty 1

## 2021-09-03 NOTE — Progress Notes (Signed)
PROGRESS NOTE  Shelia Wallace [id number] DOB: [birth year] DOA: 09/02/2021 PCP: Pcp, No  HPI/Recap of past 24 hours: Shelia Wallace is a 71 y.o. female with medical history significant of hypertension, GERD, CKD-2, who presents with fever, chills, productive cough with yellow-colored mucus, shortness breath x1 week.  Associated with fever, chills, body ache, fatigue.   CT angiogram of chest is negative for PE, but showed pulmonary nodules.  Lower extremity Doppler negative for DVT.  Patient is admitted to [place] bed as inpatient.  Work-up revealed acute hypoxic respiratory failure secondary to COVID-19 viral infection.  We will started on IV antiviral, remdesivir, IV Solu-Medrol and bronchodilators.  09/03/2021: Patient was seen and examined at her bedside.  Endorses awake.  Denies any chest pain.    Assessment/Plan: Principal Problem:   Acute hypoxemic respiratory failure due to COVID-19 [facility]) Active Problems:   Hypertension   Normocytic anemia   Lung nodule   CKD (chronic kidney disease), stage II   Anxiety  Acute hypoxemic respiratory failure due to COVID-19 [facility]):  Personally reviewed chest x-ray on admission, no pulmonary infiltrates seen.  On presentation patient required 4 L nasal cannula to maintain his O2 saturation above 92%.   Continue to wean off oxygen supplementation as tolerated.   Continue IV antiviral remdesivir, IV Solu-Medrol, bronchodilators. Continue antitussives, pulmonary toilet.     Hypertension BP is not at goal, elevated. Continue home Coreg, losartan Added p.o. hydralazine 10 mg 3 times daily. Continue to closely monitor vital signs  CKD-II: Her baseline creatinine 1.09 07/11/2021 (his husband showed showed me her lab on his phone) -f/u with BMP Renal function is stable.   Anxiety: -home Xanax 0.25 mg daily   Normocytic anemia: Hemoglobin 9.6.  Patient had a hemoglobin 12.6 on 10/03/2020.  Denies any rectal bleeding or dark stool.  Etiology is not  clear for hemoglobin drop. -Check anemia panel No overt bleeding.   Lung nodule: CTA incidentally found multiple pulmonary nodules are likely postinflammatory. Largest 5 mm solid pulmonary nodule -f/u with PCP       DVT ppx: SCD Code Status: Full code Family Communication:  Yes, patient's husband at bed side.   Disposition Plan:  Anticipate discharge back to previous environment Consults called:  none Admission status and Level of care: Telemetry Medical: as inpt           Status is: Inpatient   Remains inpatient appropriate because: Patient has some chronic comorbidities, now presents with acute hypoxia respiratory failure due to COVID-19 infection.  Patient has a new 4L of oxygen requirement.  Also has worsening anemia.  Her presentation is highly complicated.  Patient is at high risk of deteriorating.  Need to be treated in the hospital for at least 2 days.    Status is: Inpatient  Inpatient status, patient requires at least 2 midnights for further evaluation and treatment of present condition.      Objective: Vitals:   09/02/21 2046 09/02/21 2318 09/03/21 0358 09/03/21 0859  BP: (!) 138/55  (!) 145/70 (!) 167/78  Pulse: 70 62 (!) 57 (!) 53  Resp:   16 17  Temp:   98.1 F (36.7 C) 98.3 F (36.8 C)  TempSrc:      SpO2: 97% 96% 93% 98%  Weight:      Height:        Intake/Output Summary (Last 24 hours) at 09/03/2021 1243 Last data filed at 09/03/2021 1200 Gross per 24 hour  Intake 340 ml  Output --  Net  340 ml   Filed Weights   09/01/21 2050  Weight: 78 kg    Exam:  General: 71 y.o. year-old female well developed well nourished in no acute distress.  Alert and oriented x3. Cardiovascular: Regular rate and rhythm with no rubs or gallops.  No thyromegaly or JVD noted.   Respiratory: Mild rales at bases.  Mild wheezing noted. Good inspiratory effort. Abdomen: Soft nontender nondistended with normal bowel sounds x4 quadrants. Musculoskeletal: No lower  extremity edema. 2/4 pulses in all 4 extremities. Skin: No ulcerative lesions noted or rashes, Psychiatry: Mood is appropriate for condition and setting   Data Reviewed: CBC: Recent Labs  Lab 09/01/21 2105 09/03/21 0600  WBC 7.5 8.9  NEUTROABS  --  7.8*  HGB 9.6* 9.1*  HCT 29.1* 28.0*  MCV 101.0* 103.3*  PLT 243 [id number]   Basic Metabolic Panel: Recent Labs  Lab 09/01/21 2105 09/03/21 0600  NA 131* 137  K 4.1 4.5  CL 102 104  CO2 24 26  GLUCOSE 126* 158*  BUN 18 23  CREATININE 1.10* 0.82  CALCIUM 8.7* 8.9   GFR: Estimated Creatinine Clearance: 68.3 mL/min (by C-G formula based on SCr of 0.82 mg/dL). Liver Function Tests: Recent Labs  Lab 09/03/21 0600  AST 22  ALT 22  ALKPHOS 71  BILITOT 0.9  PROT 6.2*  ALBUMIN 3.6   No results for input(s): LIPASE, AMYLASE in the last 168 hours. No results for input(s): AMMONIA in the last 168 hours. Coagulation Profile: No results for input(s): INR, PROTIME in the last 168 hours. Cardiac Enzymes: No results for input(s): CKTOTAL, CKMB, CKMBINDEX, TROPONINI in the last 168 hours. BNP (last 3 results) No results for input(s): PROBNP in the last 8760 hours. HbA1C: No results for input(s): HGBA1C in the last 72 hours. CBG: No results for input(s): GLUCAP in the last 168 hours. Lipid Profile: No results for input(s): CHOL, HDL, LDLCALC, TRIG, CHOLHDL, LDLDIRECT in the last 72 hours. Thyroid Function Tests: No results for input(s): TSH, T4TOTAL, FREET4, T3FREE, THYROIDAB in the last 72 hours. Anemia Panel: Recent Labs    09/01/21 2105 09/01/21 2326  VITAMINB12 1,170*  --   FOLATE  --  9.8  FERRITIN  --  167  TIBC  --  332  IRON  --  27*  RETICCTPCT 4.7*  --    Urine analysis: No results found for: COLORURINE, APPEARANCEUR, LABSPEC, PHURINE, GLUCOSEU, HGBUR, BILIRUBINUR, KETONESUR, PROTEINUR, UROBILINOGEN, NITRITE, LEUKOCYTESUR Sepsis Labs: @LABRCNTIP (procalcitonin:4,lacticidven:4)  ) Recent Results (from the past  240 hour(s))  Resp Panel by RT-PCR (Flu A&B, Covid) Nasopharyngeal Swab     Status: Abnormal   Collection Time: 09/01/21  9:05 PM   Specimen: Nasopharyngeal Swab; Nasopharyngeal(NP) swabs in vial transport medium  Result Value Ref Range Status   SARS Coronavirus 2 by RT PCR POSITIVE (A) NEGATIVE Final    Comment: (NOTE) SARS-CoV-2 target nucleic acids are DETECTED.  The SARS-CoV-2 RNA is generally detectable in upper respiratory specimens during the acute phase of infection. Positive results are indicative of the presence of the identified virus, but do not rule out bacterial infection or co-infection with other pathogens not detected by the test. Clinical correlation with patient history and other diagnostic information is necessary to determine patient infection status. The expected result is Negative.  Fact Sheet for Patients: EntrepreneurPulse.com.au  Fact Sheet for Healthcare Providers: IncredibleEmployment.be  This test is not yet approved or cleared by the [place] FDA and  has been authorized for detection and/or diagnosis of  SARS-CoV-2 by FDA under an Emergency Use Authorization (EUA).  This EUA will remain in effect (meaning this test can be used) for the duration of  the COVID-19 declaration under Section 564(b)(1) of the A ct, 21 U.S.C. section 360bbb-3(b)(1), unless the authorization is terminated or revoked sooner.     Influenza A by PCR NEGATIVE NEGATIVE Final   Influenza B by PCR NEGATIVE NEGATIVE Final    Comment: (NOTE) The Xpert Xpress SARS-CoV-2/FLU/RSV plus assay is intended as an aid in the diagnosis of influenza from Nasopharyngeal swab specimens and should not be used as a sole basis for treatment. Nasal washings and aspirates are unacceptable for Xpert Xpress SARS-CoV-2/FLU/RSV testing.  Fact Sheet for Patients: EntrepreneurPulse.com.au  Fact Sheet for Healthcare  Providers: IncredibleEmployment.be  This test is not yet approved or cleared by the [place] FDA and has been authorized for detection and/or diagnosis of SARS-CoV-2 by FDA under an Emergency Use Authorization (EUA). This EUA will remain in effect (meaning this test can be used) for the duration of the COVID-19 declaration under Section 564(b)(1) of the Act, 21 U.S.C. section 360bbb-3(b)(1), unless the authorization is terminated or revoked.  Performed at [facility], [address] [place], [place] [address]       Studies: No results found.  Scheduled Meds:  ALPRAZolam  0.25 mg Oral Daily   B-complex with vitamin C  1 tablet Oral Daily   carvedilol  6.25 mg Oral BID   cholecalciferol  5,000 Units Oral Daily   dapsone  100 mg Oral Daily   famotidine  20 mg Oral BID   hydrALAZINE  10 mg Oral Q8H   ipratropium  2 puff Inhalation Q4H   losartan  100 mg Oral Daily   methylPREDNISolone (SOLU-MEDROL) injection  40 mg Intravenous Q12H    Continuous Infusions:  remdesivir 100 mg in NS 100 mL 100 mg (09/03/21 0919)     LOS: 1 day     Kayleen Memos, MD [facility] Pager [phone]  If 7PM-7AM, please contact night-coverage [email or web address] Password [facility] 09/03/2021, 12:43 PM

## 2021-09-03 NOTE — Progress Notes (Signed)
Patient Oxygenation at rest on 1L 98% Patient Oxygenation on RA 94% at rest.   Patient ambulated independently in room about 152ft for about 5 min without oxygen.  Patient denied any issues, tolerated well.  Oxygenation while ambulating 94-90%% on RA.    Patient demonstrated IS used at 2000 appropriately while in the room .  Encouraged IS use.   Patient assisted to bed and remained on RA. Patient to call if feeling SOB or issues and patient verbalized understanding.

## 2021-09-04 MED ORDER — ALBUTEROL SULFATE HFA 108 (90 BASE) MCG/ACT IN AERS: 2.0000 | INHALATION_SPRAY | Freq: Four times a day (QID) | RESPIRATORY_TRACT | 0 refills | Status: AC | PRN

## 2021-09-04 MED ORDER — FOLIC ACID 1 MG PO TABS: 1.0000 mg | ORAL_TABLET | Freq: Every day | ORAL | 0 refills | Status: AC

## 2021-09-04 MED ORDER — FOLIC ACID 1 MG PO TABS
1.0000 mg | ORAL_TABLET | Freq: Every day | ORAL | Status: DC
  Administered 2021-09-04: 1 mg via ORAL
  Filled 2021-09-04: qty 1

## 2021-09-04 MED ORDER — BENZONATATE 100 MG PO CAPS
200.0000 mg | ORAL_CAPSULE | Freq: Three times a day (TID) | ORAL | Status: DC
  Administered 2021-09-04: 200 mg via ORAL
  Filled 2021-09-04: qty 2

## 2021-09-04 MED ORDER — BENZONATATE 200 MG PO CAPS
200.0000 mg | ORAL_CAPSULE | Freq: Three times a day (TID) | ORAL | 0 refills | Status: AC | PRN
Start: 2021-09-04 — End: ?

## 2021-09-04 NOTE — Discharge Summary (Signed)
Discharge Summary  Shelia Wallace [id number] DOB: 31-Mar-1951  PCP: Pcp, No  Admit date: 09/02/2021 Discharge date: 09/04/2021  Time spent: 35 minutes.  Recommendations for Outpatient Follow-up:  Follow-up with your PCP Take your medications as prescribed Complete 10 days of isolation due to COVID-19 viral infection.  Discharge Diagnoses:  Active Hospital Problems   Diagnosis Date Noted   Acute hypoxemic respiratory failure due to COVID-19 [facility]) 09/02/2021   CKD (chronic kidney disease), stage II 09/02/2021   Anxiety 09/02/2021   Hypertension    Normocytic anemia    Lung nodule     Resolved Hospital Problems  No resolved problems to display.    Discharge Condition: Stable  Diet recommendation: Resume previous diet  Vitals:   09/04/21 0426 09/04/21 0804  BP: (!) 141/72 (!) 162/60  Pulse: 63 (!) 51  Resp: 16 17  Temp: 98.2 F (36.8 C) 98.2 F (36.8 C)  SpO2: 93% 92%    History of present illness:  Shelia Wallace is a 71 y.o. female with medical history significant of hypertension, GERD, CKD-2, who presents with fever, chills, productive cough with yellow-colored mucus, shortness breath x1 week.  Associated with fever, chills, body ache, fatigue.   CT angiogram of chest is negative for PE, but showed pulmonary nodules.  Lower extremity Doppler negative for DVT.  Patient is admitted to [place] bed as inpatient.  Work-up revealed acute hypoxic respiratory failure secondary to COVID-19 viral infection.  We will started on IV antiviral, remdesivir, IV Solu-Medrol and bronchodilators.   09/04/2021: Patient was seen and examined with her husband at bedside.  No acute events overnight.  No new complaints.  She is eager to go home.  Hospital Course:  Principal Problem:   Acute hypoxemic respiratory failure due to COVID-19 [facility]) Active Problems:   Hypertension   Normocytic anemia   Lung nodule   CKD (chronic kidney disease), stage II   Anxiety  Resolved acute hypoxemic  respiratory failure due to COVID-19 [facility]):  Received 3 days of IV antiviral remdesivir Received 3 days of steroids, bronchodilators, pulmonary toilet. Albuterol inhaler 3 times daily as needed for wheezing or shortness of breath, Tessalon Perles, 3 times daily as needed for cough. Positive COVID-19 screening test on 09/01/2021.  Complete 10 days of isolation.  Please wear a mask in public.  Hypertension BP is not at goal, elevated, likely contributed by IV steroids Resume home regimen. Follow-up with PCP.   CKD-II:  Renal function back to baseline 0.8 with GFR greater than 60 on 09/03/2021. Continue to avoid nephrotoxic agents, dehydration and hypotension.   Anxiety: Stable Resume home regimen Follow-up with your PCP.   Chronic macrocytic anemia:  In the setting of acute illness Hemoglobin 9.1, MCV 103 No overt bleeding. Follow-up with your PCP.   Lung nodules: CTA incidentally found multiple pulmonary nodules are likely postinflammatory. Largest 5 mm solid pulmonary nodule Follow-up with your PCP.        Code Status: Full code    Discharge Exam: BP (!) 162/60 (BP Location: Right Arm) Comment: RN Kayla notified   Pulse (!) 51    Temp 98.2 F (36.8 C)    Resp 17    Ht 5\' 6"  (1.676 m)    Wt 78 kg    SpO2 92%    BMI 27.76 kg/m  General: 71 y.o. year-old female well developed well nourished in no acute distress.  Alert and oriented x3. Cardiovascular: Regular rate and rhythm with no rubs or gallops.  No thyromegaly or  JVD noted.   Respiratory: Clear to auscultation with no wheezes or rales. Good inspiratory effort. Abdomen: Soft nontender nondistended with normal bowel sounds x4 quadrants. Musculoskeletal: No lower extremity edema. 2/4 pulses in all 4 extremities. Skin: No ulcerative lesions noted or rashes, Psychiatry: Mood is appropriate for condition and setting  Discharge Instructions You were cared for by a hospitalist during your hospital stay. If you have any questions  about your discharge medications or the care you received while you were in the hospital after you are discharged, you can call the unit and asked to speak with the hospitalist on call if the hospitalist that took care of you is not available. Once you are discharged, your primary care physician will handle any further medical issues. Please note that NO REFILLS for any discharge medications will be authorized once you are discharged, as it is imperative that you return to your primary care physician (or establish a relationship with a primary care physician if you do not have one) for your aftercare needs so that they can reassess your need for medications and monitor your lab values.   Allergies as of 09/04/2021   No Known Allergies      Medication List     STOP taking these medications    Advair Diskus 100-50 MCG/ACT Aepb Generic drug: fluticasone-salmeterol   ALPRAZolam 0.25 MG tablet Commonly known as: XANAX   gabapentin 300 MG capsule Commonly known as: NEURONTIN   predniSONE 10 MG (21) Tbpk tablet Commonly known as: STERAPRED UNI-PAK 21 TAB       TAKE these medications    albuterol 108 (90 Base) MCG/ACT inhaler Commonly known as: VENTOLIN HFA Inhale 2 puffs into the lungs every 6 (six) hours as needed for wheezing or shortness of breath. What changed: how much to take   B-complex with vitamin C tablet Take 1 tablet by mouth daily.   benzonatate 200 MG capsule Commonly known as: TESSALON Take 1 capsule (200 mg total) by mouth 3 (three) times daily as needed for cough.   BLACK ELDERBERRY PO Take 1 tablet by mouth daily.   carvedilol 6.25 MG tablet Commonly known as: COREG Take 6.25 mg by mouth 2 (two) times daily.   dapsone 100 MG tablet Take 100 mg by mouth daily.   famotidine 20 MG tablet Commonly known as: PEPCID Take 20 mg by mouth 2 (two) times daily.   folic acid 1 MG tablet Commonly known as: FOLVITE Take 1 tablet (1 mg total) by mouth daily. Start  taking on: September 05, 2021   ipratropium 0.06 % nasal spray Commonly known as: ATROVENT Place 2 sprays into both nostrils 4 (four) times daily.   losartan 100 MG tablet Commonly known as: COZAAR Take 100 mg by mouth daily.   Vitamin D-3 125 MCG (5000 UT) Tabs Take 1 tablet by mouth daily.       No Known Allergies    The results of significant diagnostics from this hospitalization (including imaging, microbiology, ancillary and laboratory) are listed below for reference.    Significant Diagnostic Studies: DG Chest 2 View  Result Date: 09/01/2021 CLINICAL DATA:  Chest pain radiating to back, short of breath, hematemesis EXAM: CHEST - 2 VIEW COMPARISON:  None. FINDINGS: Frontal and lateral views of the chest demonstrate an unremarkable cardiac silhouette. No acute airspace disease, effusion, or pneumothorax. No acute bony abnormalities. IMPRESSION: 1. No acute intrathoracic process. Electronically Signed   By: Sharlet Salina M.D.   On: 09/01/2021 21:55  CT Angio Chest PE W and/or Wo Contrast  Result Date: 09/02/2021 CLINICAL DATA:  71 year old female with chest pain radiating to the back, shortness of breath and hematemesis. EXAM: CT ANGIOGRAPHY CHEST WITH CONTRAST TECHNIQUE: Multidetector CT imaging of the chest was performed using the standard protocol during bolus administration of intravenous contrast. Multiplanar CT image reconstructions and MIPs were obtained to evaluate the vascular anatomy. CONTRAST:  51mL OMNIPAQUE IOHEXOL 350 MG/ML SOLN COMPARISON:  Chest radiographs 09/01/2021. FINDINGS: Cardiovascular: Good contrast bolus timing in the pulmonary arterial tree. No focal filling defect identified in the pulmonary arteries to suggest acute pulmonary embolism. Calcified atherosclerosis of the descending thoracic aorta which otherwise appears negative. No definite calcified coronary artery plaque. Mediastinum/Nodes: Negative.  No mediastinal lymphadenopathy. Lungs/Pleura: Major  airways are patent. Relatively normal lung volumes. Minimal dependent atelectasis. There are two small right middle lobe pulmonary nodules along the minor fissure. A subpleural nodule is calcified on series 6, image 41 compatible with small granuloma. 5 mm solid but noncalcified nodule on series 6, image 45 and series 10, image 55. superimposed mild scarring in both the right middle lobe and lung apices. Upper Abdomen: Negative visible liver, spleen, pancreas, adrenal glands and bowel in the upper abdomen. Musculoskeletal: There are several chronic left lateral rib fractures. No acute osseous abnormality identified. Review of the MIP images confirms the above findings. IMPRESSION: 1. Negative for acute pulmonary embolus. 2. Multiple pulmonary nodules are likely postinflammatory. Largest: 5 mm solid pulmonary nodule. No routine follow-up imaging is recommended per Fleischner Society Guidelines. These guidelines do not apply to immunocompromised patients and patients with cancer. Follow up in patients with significant comorbidities as clinically warranted. For lung cancer screening, adhere to Lung-RADS guidelines. Reference: Radiology. 2017; 284(1):228-43. 3. Aortic Atherosclerosis (ICD10-I70.0). Electronically Signed   By: Genevie Ann M.D.   On: 09/02/2021 04:56   US Venous Img Lower Bilateral (DVT)  Result Date: 09/02/2021 CLINICAL DATA:  Positive D-dimer, COVID positive EXAM: BILATERAL LOWER EXTREMITY VENOUS DOPPLER ULTRASOUND TECHNIQUE: Gray-scale sonography with graded compression, as well as color Doppler and duplex ultrasound were performed to evaluate the lower extremity deep venous systems from the level of the common femoral vein and including the common femoral, femoral, profunda femoral, popliteal and calf veins including the posterior tibial, peroneal and gastrocnemius veins when visible. The superficial great saphenous vein was also interrogated. Spectral Doppler was utilized to evaluate flow at rest and  with distal augmentation maneuvers in the common femoral, femoral and popliteal veins. COMPARISON:  None. FINDINGS: RIGHT LOWER EXTREMITY Common Femoral Vein: No evidence of thrombus. Normal compressibility, respiratory phasicity and response to augmentation. Saphenofemoral Junction: No evidence of thrombus. Normal compressibility and flow on color Doppler imaging. Profunda Femoral Vein: No evidence of thrombus. Normal compressibility and flow on color Doppler imaging. Femoral Vein: No evidence of thrombus. Normal compressibility, respiratory phasicity and response to augmentation. Popliteal Vein: No evidence of thrombus. Normal compressibility, respiratory phasicity and response to augmentation. Calf Veins: No evidence of thrombus. Normal compressibility and flow on color Doppler imaging. Superficial Great Saphenous Vein: No evidence of thrombus. Normal compressibility. Venous Reflux:  None. Other Findings:  None. LEFT LOWER EXTREMITY Common Femoral Vein: No evidence of thrombus. Normal compressibility, respiratory phasicity and response to augmentation. Saphenofemoral Junction: No evidence of thrombus. Normal compressibility and flow on color Doppler imaging. Profunda Femoral Vein: No evidence of thrombus. Normal compressibility and flow on color Doppler imaging. Femoral Vein: No evidence of thrombus. Normal compressibility, respiratory phasicity and response to augmentation. Popliteal  Vein: No evidence of thrombus. Normal compressibility, respiratory phasicity and response to augmentation. Calf Veins: No evidence of thrombus. Normal compressibility and flow on color Doppler imaging. Superficial Great Saphenous Vein: No evidence of thrombus. Normal compressibility. Venous Reflux:  None. Other Findings:  None. IMPRESSION: No evidence of deep venous thrombosis in either lower extremity. Electronically Signed   By: Jacqulynn Cadet M.D.   On: 09/02/2021 10:34    Microbiology: Recent Results (from the past 240  hour(s))  Resp Panel by RT-PCR (Flu A&B, Covid) Nasopharyngeal Swab     Status: Abnormal   Collection Time: 09/01/21  9:05 PM   Specimen: Nasopharyngeal Swab; Nasopharyngeal(NP) swabs in vial transport medium  Result Value Ref Range Status   SARS Coronavirus 2 by RT PCR POSITIVE (A) NEGATIVE Final    Comment: (NOTE) SARS-CoV-2 target nucleic acids are DETECTED.  The SARS-CoV-2 RNA is generally detectable in upper respiratory specimens during the acute phase of infection. Positive results are indicative of the presence of the identified virus, but do not rule out bacterial infection or co-infection with other pathogens not detected by the test. Clinical correlation with patient history and other diagnostic information is necessary to determine patient infection status. The expected result is Negative.  Fact Sheet for Patients: EntrepreneurPulse.com.au  Fact Sheet for Healthcare Providers: IncredibleEmployment.be  This test is not yet approved or cleared by the [place] FDA and  has been authorized for detection and/or diagnosis of SARS-CoV-2 by FDA under an Emergency Use Authorization (EUA).  This EUA will remain in effect (meaning this test can be used) for the duration of  the COVID-19 declaration under Section 564(b)(1) of the A ct, 21 U.S.C. section 360bbb-3(b)(1), unless the authorization is terminated or revoked sooner.     Influenza A by PCR NEGATIVE NEGATIVE Final   Influenza B by PCR NEGATIVE NEGATIVE Final    Comment: (NOTE) The Xpert Xpress SARS-CoV-2/FLU/RSV plus assay is intended as an aid in the diagnosis of influenza from Nasopharyngeal swab specimens and should not be used as a sole basis for treatment. Nasal washings and aspirates are unacceptable for Xpert Xpress SARS-CoV-2/FLU/RSV testing.  Fact Sheet for Patients: EntrepreneurPulse.com.au  Fact Sheet for Healthcare  Providers: IncredibleEmployment.be  This test is not yet approved or cleared by the [place] FDA and has been authorized for detection and/or diagnosis of SARS-CoV-2 by FDA under an Emergency Use Authorization (EUA). This EUA will remain in effect (meaning this test can be used) for the duration of the COVID-19 declaration under Section 564(b)(1) of the Act, 21 U.S.C. section 360bbb-3(b)(1), unless the authorization is terminated or revoked.  Performed at [facility], [address] [place], [place] [address]      Labs: Basic Metabolic Panel: Recent Labs  Lab 09/01/21 2105 09/03/21 0600  NA 131* 137  K 4.1 4.5  CL 102 104  CO2 24 26  GLUCOSE 126* 158*  BUN 18 23  CREATININE 1.10* 0.82  CALCIUM 8.7* 8.9   Liver Function Tests: Recent Labs  Lab 09/03/21 0600  AST 22  ALT 22  ALKPHOS 71  BILITOT 0.9  PROT 6.2*  ALBUMIN 3.6   No results for input(s): LIPASE, AMYLASE in the last 168 hours. No results for input(s): AMMONIA in the last 168 hours. CBC: Recent Labs  Lab 09/01/21 2105 09/03/21 0600  WBC 7.5 8.9  NEUTROABS  --  7.8*  HGB 9.6* 9.1*  HCT 29.1* 28.0*  MCV 101.0* 103.3*  PLT 243 252   Cardiac Enzymes:  No results for input(s): CKTOTAL, CKMB, CKMBINDEX, TROPONINI in the last 168 hours. BNP: BNP (last 3 results) No results for input(s): BNP in the last 8760 hours.  ProBNP (last 3 results) No results for input(s): PROBNP in the last 8760 hours.  CBG: No results for input(s): GLUCAP in the last 168 hours.     Signed:  Kayleen Memos, MD [facility] 09/04/2021, 3:37 PM

## 2021-09-04 NOTE — Plan of Care (Signed)
°  Problem: Clinical Measurements: Goal: Ability to maintain clinical measurements within normal limits will improve Outcome: Progressing Goal: Diagnostic test results will improve Outcome: Progressing Goal: Respiratory complications will improve Outcome: Progressing Goal: Cardiovascular complication will be avoided Outcome: Progressing   Problem: Clinical Measurements: Goal: Diagnostic test results will improve Outcome: Progressing   Problem: Clinical Measurements: Goal: Respiratory complications will improve Outcome: Progressing   Problem: Clinical Measurements: Goal: Cardiovascular complication will be avoided Outcome: Progressing

## 2021-09-04 NOTE — Progress Notes (Signed)
Warnell Bureau to be D/C'd Home per MD order.  Discussed with the patient and all questions fully answered.  VSS, Skin clean, dry and intact without evidence of skin break down, no evidence of skin tears noted. IV catheter discontinued intact. Site without signs and symptoms of complications. Dressing and pressure applied.  An After Visit Summary was printed and given to the patient. Patient prescriptions sent to pharmacy.  D/c education completed with patient/family including follow up instructions, medication list, d/c activities limitations if indicated, with other d/c instructions as indicated by MD - patient able to verbalize understanding, all questions fully answered.   Patient instructed to return to ED, call 911, or call MD for any changes in condition.   Patient escorted via WC, and D/C home via private auto.  Pauletta Browns 09/04/2021 12:18 PM

## 2021-09-15 ENCOUNTER — Encounter: Payer: Self-pay | Admitting: Emergency Medicine

## 2021-09-15 ENCOUNTER — Ambulatory Visit (INDEPENDENT_AMBULATORY_CARE_PROVIDER_SITE_OTHER): Payer: Medicare Other

## 2021-09-15 ENCOUNTER — Other Ambulatory Visit: Payer: Self-pay

## 2021-09-15 ENCOUNTER — Ambulatory Visit
Admission: EM | Admit: 2021-09-15 | Discharge: 2021-09-15 | Disposition: A | Discharge status: Home / Self Care | Payer: Medicare Other | Attending: Emergency Medicine | Admitting: Emergency Medicine

## 2021-09-15 DIAGNOSIS — R059 Cough, unspecified: Secondary | ICD-10-CM | POA: Diagnosis not present

## 2021-09-15 DIAGNOSIS — R051 Acute cough: Secondary | ICD-10-CM | POA: Diagnosis not present

## 2021-09-15 MED ORDER — PROMETHAZINE-DM 6.25-15 MG/5ML PO SYRP: 5.0000 mL | ORAL_SOLUTION | Freq: Four times a day (QID) | ORAL | 0 refills | Status: AC | PRN

## 2021-09-15 MED ORDER — PREDNISONE 10 MG (21) PO TBPK: ORAL_TABLET | Freq: Every day | ORAL | 0 refills | Status: AC

## 2021-09-15 NOTE — Discharge Instructions (Signed)
Your chest x-ray was negative for infection or fluid  Take prednisone every morning with food as directed on package  You may use cough syrup every 6 hours, be mindful this medication may make you drowsy  You may continue use of albuterol inhaler to help manage shortness of breath  If symptoms continue to persist you may follow-up at urgent care or with your primary doctor when you return home

## 2021-09-15 NOTE — ED Triage Notes (Signed)
Pt states she was in the hospital from pneumonia on 09/01/21. She states she was told she needed to have her lungs listened to to make sure she was getting better. She states she is feeling better. She is still coughing and fatigued. She is still taking her inhaler.

## 2021-09-15 NOTE — ED Provider Notes (Signed)
[facility]    CSN: [id number] Arrival date & time: 09/15/21  [id number]      History   Chief Complaint Chief Complaint  Patient presents with   Pneumonia    HPI Shelia Wallace is a 71 y.o. female.   Patient presents requesting evaluation lungs after recent COVID-pneumonia requiring hospital admission.  Discharged from hospital on 09/02/2021, recommended follow-up with primary care doctor in 10 days however patient's PCP is located in [facility].  Endorses that while she does feel better she still has a productive cough and intermittent shortness of breath with exertion .has attempted use of Tessalon pills , albuterol inhaler and saline irrigation which has been minimally helpful.  Tolerating food and liquids.  Denies fever, chills, body aches, chest pain or tightness, wheezing, nasal congestion, rhinorrhea, sinus pain or pressure, sore throat.  History of anxiety, CKD, GERD, hypertension.  Past Medical History:  Diagnosis Date   Anxiety    CKD (chronic kidney disease), stage II    GERD (gastroesophageal reflux disease)    Hypertension     Patient Active Problem List   Diagnosis Date Noted   Acute hypoxemic respiratory failure due to COVID-19 ([place]) 09/02/2021   CKD (chronic kidney disease), stage II 09/02/2021   Anxiety 09/02/2021   Hypertension    Normocytic anemia    Lung nodule     Past Surgical History:  Procedure Laterality Date   CERVICAL FUSION      OB History   No obstetric history on file.      Home Medications    Prior to Admission medications   Medication Sig Start Date End Date Taking? Authorizing Provider  albuterol (VENTOLIN HFA) 108 (90 Base) MCG/ACT inhaler Inhale 2 puffs into the lungs every 6 (six) hours as needed for wheezing or shortness of breath. 09/04/21  Yes Hall, Carole N, DO  B Complex-C (B-COMPLEX WITH VITAMIN C) tablet Take 1 tablet by mouth daily.   Yes [provider]  BLACK ELDERBERRY PO Take 1 tablet by mouth daily.    Yes [provider]  carvedilol (COREG) 6.25 MG tablet Take 6.25 mg by mouth 2 (two) times daily. 05/17/21  Yes [provider]  Cholecalciferol (VITAMIN D-3) 125 MCG (5000 UT) TABS Take 1 tablet by mouth daily.   Yes [provider]  dapsone 100 MG tablet Take 100 mg by mouth daily. 07/22/21  Yes [provider]  folic acid (FOLVITE) 1 MG tablet Take 1 tablet (1 mg total) by mouth daily. 09/05/21 12/04/21 Yes Hall, Carole N, DO  ipratropium (ATROVENT) 0.06 % nasal spray Place 2 sprays into both nostrils 4 (four) times daily. 05/20/21  Yes Margarette Canada, NP  losartan (COZAAR) 100 MG tablet Take 100 mg by mouth daily. 05/17/21  Yes [provider]  benzonatate (TESSALON) 200 MG capsule Take 1 capsule (200 mg total) by mouth 3 (three) times daily as needed for cough. 09/04/21   Kayleen Memos, DO  famotidine (PEPCID) 20 MG tablet Take 20 mg by mouth 2 (two) times daily.    [provider]    Family History Family History  Problem Relation Age of Onset   Heart disease Father    Heart attack Sister    Lung cancer Brother     Social History Social History   Tobacco Use   Smoking status: Never   Smokeless tobacco: Never  Vaping Use   Vaping Use: Never used  Substance Use Topics   Alcohol use: Not Currently  Drug use: Never     Allergies   Patient has no known allergies.   Review of Systems Review of Systems  Constitutional: Negative.   HENT: Negative.    Respiratory:  Positive for cough and shortness of breath. Negative for apnea, choking, chest tightness, wheezing and stridor.   Cardiovascular: Negative.   Skin: Negative.   Neurological: Negative.     Physical Exam Triage Vital Signs ED Triage Vitals  Enc Vitals Group     BP 09/15/21 0840 (!) 158/92     Pulse Rate 09/15/21 0840 65     Resp 09/15/21 0840 18     Temp 09/15/21 0840 98.7 F (37.1 C)     Temp Source 09/15/21 0840 Oral     SpO2 09/15/21 0840 90 %     Weight  09/15/21 0837 171 lb 15.3 oz (78 kg)     Height 09/15/21 0837 5\' 6"  (1.676 m)     Head Circumference --      Peak Flow --      Pain Score 09/15/21 0835 5     Pain Loc --      Pain Edu? --      Excl. in [place]? --    No data found.  Updated Vital Signs BP (!) 158/92 (BP Location: Left Arm)    Pulse 65    Temp 98.7 F (37.1 C) (Oral)    Resp 18    Ht 5\' 6"  (1.676 m)    Wt 171 lb 15.3 oz (78 kg)    SpO2 90%    BMI 27.75 kg/m   Visual Acuity Right Eye Distance:   Left Eye Distance:   Bilateral Distance:    Right Eye Near:   Left Eye Near:    Bilateral Near:     Physical Exam Constitutional:      Appearance: Normal appearance. She is normal weight.  HENT:     Head: Normocephalic.  Eyes:     Extraocular Movements: Extraocular movements intact.  Cardiovascular:     Rate and Rhythm: Normal rate and regular rhythm.     Pulses: Normal pulses.     Heart sounds: Normal heart sounds.  Pulmonary:     Effort: Pulmonary effort is normal.     Breath sounds: Normal breath sounds.  Skin:    General: Skin is warm and dry.  Neurological:     Mental Status: She is alert and oriented to person, place, and time. Mental status is at baseline.  Psychiatric:        Mood and Affect: Mood normal.        Behavior: Behavior normal.     UC Treatments / Results  Labs (all labs ordered are listed, but only abnormal results are displayed) Labs Reviewed - No data to display  EKG   Radiology No results found.  Procedures Procedures (including critical care time)  Medications Ordered in UC Medications - No data to display  Initial Impression / Assessment and Plan / UC Course  I have reviewed the triage vital signs and the nursing notes.  Pertinent labs & imaging results that were available during my care of the patient were reviewed by me and considered in my medical decision making (see chart for details).  Acute cough  Chest x-ray negative showing hyperinflation, discussed with  patient, prednisone 60 mg taper prescribed as well as Promethazine DM to help manage coughing, vital signs are stable, patient in no signs of distress, encourage continued use of albuterol inhaler  as well as saline irrigation if helpful, may follow-up with urgent care or with PCP if returning home for further evaluation if symptoms continue to persist Final Clinical Impressions(s) / UC Diagnoses   Final diagnoses:  None   Discharge Instructions   None    ED Prescriptions   None    PDMP not reviewed this encounter.   Hans Eden, [place] 09/15/21 [phone]

## 2021-09-22 ENCOUNTER — Ambulatory Visit
Admission: EM | Admit: 2021-09-22 | Discharge: 2021-09-22 | Disposition: A | Discharge status: Home / Self Care | Payer: Medicare Other | Attending: Emergency Medicine | Admitting: Emergency Medicine

## 2021-09-22 ENCOUNTER — Other Ambulatory Visit: Payer: Self-pay

## 2021-09-22 DIAGNOSIS — H6981 Other specified disorders of Eustachian tube, right ear: Secondary | ICD-10-CM

## 2021-09-22 MED ORDER — FLUTICASONE PROPIONATE 50 MCG/ACT NA SUSP: 2.0000 | Freq: Every day | NASAL | 1 refills | Status: AC

## 2021-09-22 NOTE — Discharge Instructions (Signed)
Take over-the-counter Zyrtec, Claritin, or Allegra once daily to help with allergic symptoms.  Instill 2 squirts of fluticasone in each nostril at bedtime nightly.  And the nasal away from the septum of your nose and follow each set of squirts with 1 squirt of nasal saline to push the particles up into your turbinates where they will take effect.  Continue to equalize your ears as shown to help clear mucus from eustachian tubes and maintain patency.  Continue to use heat therapy on your ear at bedtime to sooth the ear and help with pain relief.  Perform sinus irrigation with a Neilmed sinus rinse kit and distilled water 2-3 times a day to clear your eustachian tube. Do not use tap water.

## 2021-09-22 NOTE — ED Triage Notes (Signed)
Pt here with C/O right ear pain, was here on 09/15/2021 had pain then and thought the medication would help clear it up but hasn't.

## 2021-09-22 NOTE — ED Provider Notes (Signed)
[facility]    CSN: [id number] Arrival date & time: 09/22/21  1053      History   Chief Complaint Chief Complaint  Patient presents with   Otalgia    right    HPI Shelia Wallace is a 71 y.o. female.   HPI  71 year old female here for evaluation of right ear pain.  Patient ports that she has been experiencing right ear pain since she was evaluated on 09/15/2021 here in the urgent care.  She reports that she has had an increase in ringing in her ears and also popping with chewing and swallowing.  She denies any changes in hearing or drainage from her ear.  Past Medical History:  Diagnosis Date   Anxiety    CKD (chronic kidney disease), stage II    GERD (gastroesophageal reflux disease)    Hypertension     Patient Active Problem List   Diagnosis Date Noted   Acute hypoxemic respiratory failure due to COVID-19 ([place]) 09/02/2021   CKD (chronic kidney disease), stage II 09/02/2021   Anxiety 09/02/2021   Hypertension    Normocytic anemia    Lung nodule     Past Surgical History:  Procedure Laterality Date   CERVICAL FUSION      OB History   No obstetric history on file.      Home Medications    Prior to Admission medications   Medication Sig Start Date End Date Taking? Authorizing Provider  albuterol (VENTOLIN HFA) 108 (90 Base) MCG/ACT inhaler Inhale 2 puffs into the lungs every 6 (six) hours as needed for wheezing or shortness of breath. 09/04/21  Yes Hall, Carole N, DO  B Complex-C (B-COMPLEX WITH VITAMIN C) tablet Take 1 tablet by mouth daily.   Yes [provider]  benzonatate (TESSALON) 200 MG capsule Take 1 capsule (200 mg total) by mouth 3 (three) times daily as needed for cough. 09/04/21  Yes Hall, Carole N, DO  BLACK ELDERBERRY PO Take 1 tablet by mouth daily.   Yes [provider]  carvedilol (COREG) 6.25 MG tablet Take 6.25 mg by mouth 2 (two) times daily. 05/17/21  Yes [provider]  Cholecalciferol (VITAMIN D-3)  125 MCG (5000 UT) TABS Take 1 tablet by mouth daily.   Yes [provider]  dapsone 100 MG tablet Take 100 mg by mouth daily. 07/22/21  Yes [provider]  famotidine (PEPCID) 20 MG tablet Take 20 mg by mouth 2 (two) times daily.   Yes [provider]  fluticasone (FLONASE) 50 MCG/ACT nasal spray Place 2 sprays into both nostrils daily. 09/22/21  Yes Margarette Canada, NP  folic acid (FOLVITE) 1 MG tablet Take 1 tablet (1 mg total) by mouth daily. 09/05/21 12/04/21 Yes Hall, Carole N, DO  ipratropium (ATROVENT) 0.06 % nasal spray Place 2 sprays into both nostrils 4 (four) times daily. 05/20/21  Yes Margarette Canada, NP  losartan (COZAAR) 100 MG tablet Take 100 mg by mouth daily. 05/17/21  Yes [provider]  predniSONE (STERAPRED UNI-PAK 21 TAB) 10 MG (21) TBPK tablet Take by mouth daily. Take 6 tabs by mouth daily  for 2 days, then 5 tabs for 2 days, then 4 tabs for 2 days, then 3 tabs for 2 days, 2 tabs for 2 days, then 1 tab by mouth daily for 2 days 09/15/21  Yes White, Adrienne R, NP  promethazine-dextromethorphan (PROMETHAZINE-DM) 6.25-15 MG/5ML syrup Take 5 mLs by mouth 4 (four) times daily as needed for cough. 09/15/21  Yes Hans Eden, NP    Family History Family History  Problem Relation Age of Onset   Heart disease Father    Heart attack Sister    Lung cancer Brother     Social History Social History   Tobacco Use   Smoking status: Never   Smokeless tobacco: Never  Vaping Use   Vaping Use: Never used  Substance Use Topics   Alcohol use: Not Currently   Drug use: Never     Allergies   Patient has no known allergies.   Review of Systems Review of Systems  Constitutional:  Negative for fever.  HENT:  Positive for ear pain. Negative for ear discharge and hearing loss.   Hematological: Negative.   Psychiatric/Behavioral: Negative.      Physical Exam Triage Vital Signs ED Triage Vitals  Enc Vitals Group     BP 09/22/21 1256 (!) 167/85      Pulse Rate 09/22/21 1256 (!) 57     Resp 09/22/21 1256 18     Temp 09/22/21 1256 98.3 F (36.8 C)     Temp Source 09/22/21 1256 Oral     SpO2 09/22/21 1256 93 %     Weight 09/22/21 1255 171 lb 15.3 oz (78 kg)     Height 09/22/21 1255 _0  (1.676 m)     Head Circumference --      Peak Flow --      Pain Score --      Pain Loc --      Pain Edu? --      Excl. in [place]? --    No data found.  Updated Vital Signs BP (!) 167/85    Pulse (!) 57    Temp 98.3 F (36.8 C) (Oral)    Resp 18    Ht _1  (1.676 m)    Wt 171 lb 15.3 oz (78 kg)    SpO2 93%    BMI 27.75 kg/m   Visual Acuity Right Eye Distance:   Left Eye Distance:   Bilateral Distance:    Right Eye Near:   Left Eye Near:    Bilateral Near:     Physical Exam Vitals and nursing note reviewed.  Constitutional:      General: She is not in acute distress.    Appearance: Normal appearance. She is not ill-appearing.  HENT:     Head: Normocephalic and atraumatic.     Right Ear: Tympanic membrane, ear canal and external ear normal. There is no impacted cerumen.     Left Ear: Tympanic membrane, ear canal and external ear normal. There is no impacted cerumen.  Skin:    General: Skin is warm and dry.     Capillary Refill: Capillary refill takes less than 2 seconds.     Findings: No erythema or rash.  Neurological:     General: No focal deficit present.     Mental Status: She is alert and oriented to person, place, and time.  Psychiatric:        Mood and Affect: Mood normal.        Behavior: Behavior normal.        Thought Content: Thought content normal.        Judgment: Judgment normal.     UC Treatments / Results  Labs (all labs ordered are listed, but only abnormal results are displayed) Labs Reviewed - No data to display  EKG   Radiology No results found.  Procedures Procedures (including critical care  time)  Medications Ordered in UC Medications - No data to display  Initial Impression / Assessment and  Plan / UC Course  I have reviewed the triage vital signs and the nursing notes.  Pertinent labs & imaging results that were available during my care of the patient were reviewed by me and considered in my medical decision making (see chart for details).  Patient is a very pleasant, nontoxic-appearing 71 year old female here for evaluation of right ear pain with increased ringing and popping when chewing or swallowing.  She also reports that when she blows her nose she will have increased pressure in her right ear.  Patient reports that she has been using a Nettie pot at home to help alleviate upper respiratory congestion.  She states that after using the Nettie pot her ear pain developed.  She has been using a hot water bottle underneath her pillowcase at night for relief of pain and it has not been helping her symptoms.  On exam patient's tympanic membrane's are pearly gray bilaterally with normal light reflex and clear external auditory canals.  Patient has no tenderness with palpation of the eustachian tubes bilaterally and she has no tenderness to palpation of her temporomandibular joints.  I suspect patient has some mild eustachian tube dysfunction giving how her symptoms were today.  I will place her on Flonase, 2 squirts up each nostril at bedtime, have her continue performing her Nettie pot treatments, use over-the-counter antihistamine such as Claritin or Zyrtec, and also equalize her ear sporadically throughout the day to maintain patency.   Final Clinical Impressions(s) / UC Diagnoses   Final diagnoses:  Eustachian tube dysfunction, right     Discharge Instructions      Take over-the-counter Zyrtec, Claritin, or Allegra once daily to help with allergic symptoms.  Instill 2 squirts of fluticasone in each nostril at bedtime nightly.  And the nasal away from the septum of your nose and follow each set of squirts with 1 squirt of nasal saline to push the particles up into your turbinates  where they will take effect.  Continue to equalize your ears as shown to help clear mucus from eustachian tubes and maintain patency.  Continue to use heat therapy on your ear at bedtime to sooth the ear and help with pain relief.  Perform sinus irrigation with a Neilmed sinus rinse kit and distilled water 2-3 times a day to clear your eustachian tube. Do not use tap water.       ED Prescriptions     Medication Sig Dispense Auth. Provider   fluticasone (FLONASE) 50 MCG/ACT nasal spray Place 2 sprays into both nostrils daily. 18.2 mL Margarette Canada, NP      PDMP not reviewed this encounter.   Margarette Canada, NP 09/22/21 1406

## 2022-09-02 ENCOUNTER — Ambulatory Visit: Admit: 2022-09-02 | Discharge status: Against Medical Advice | Payer: Medicare Other

## 2023-01-15 IMAGING — US US EXTREM LOW VENOUS
1 series · 13 of 24 positions shown · non-contrast
Comparison: None.

CLINICAL DATA: Positive D-dimer, COVID positive



[Series 1: us venous img lower bilat (dvt) · portal-venous · 13 of 56 slices shown]
[im 1/56]
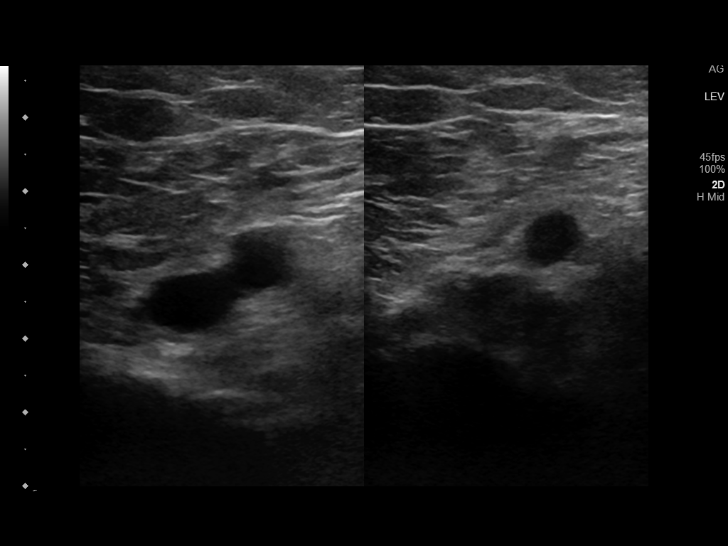
[im 5/56]
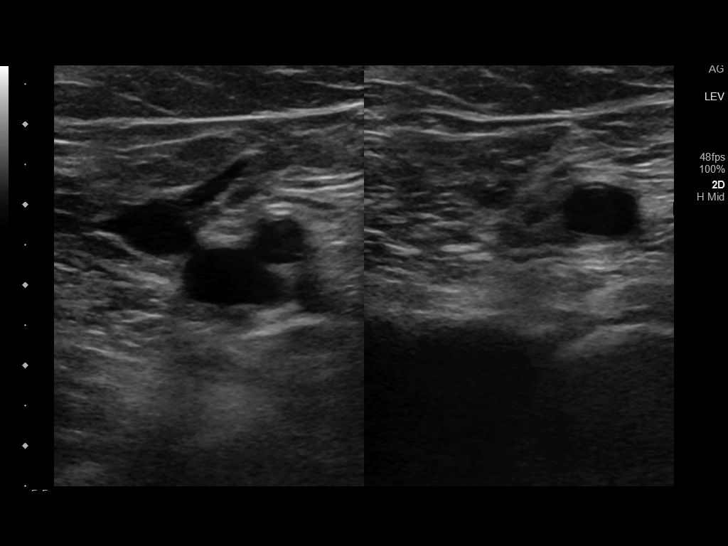
[im 10/56]
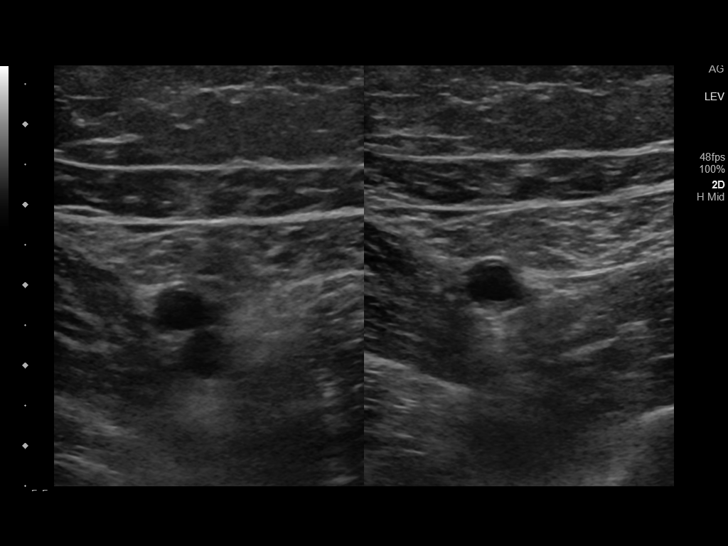
[im 15/56]
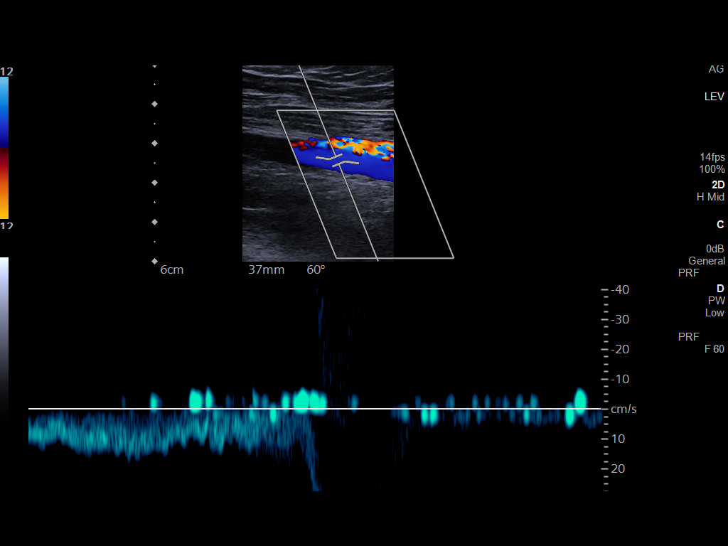
[im 20/56]
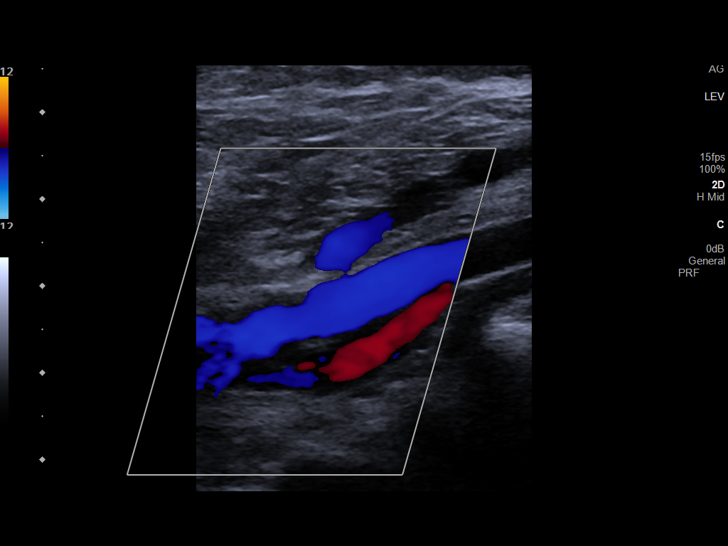
[im 24/56]
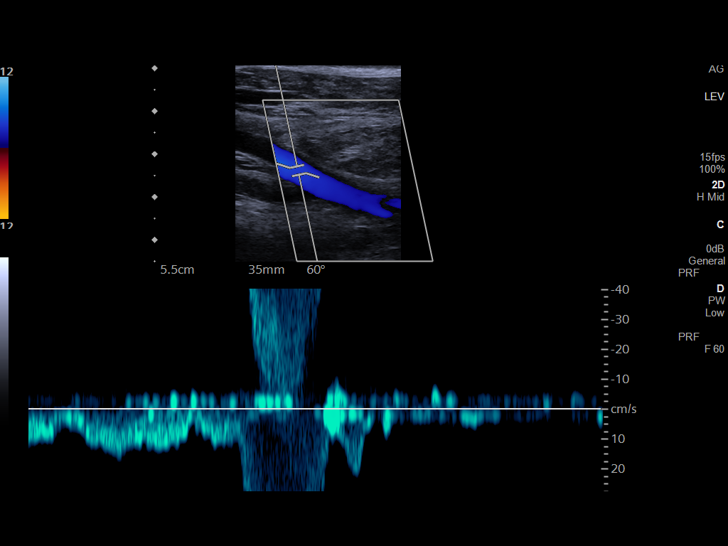
[im 29/56]
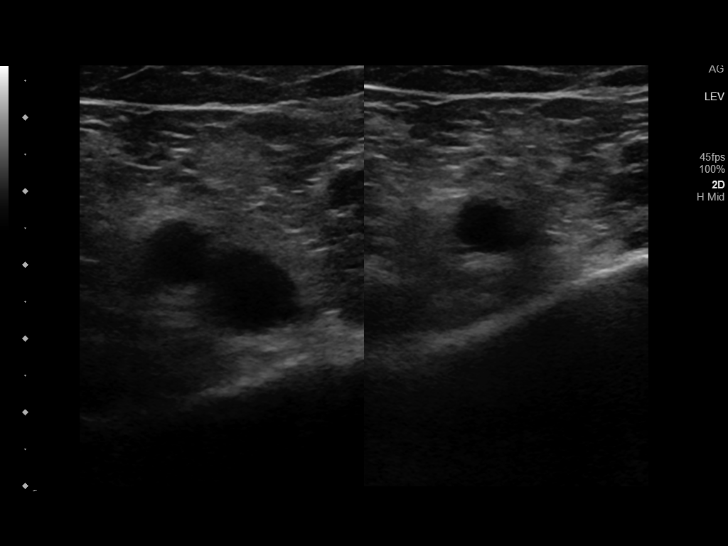
[im 32/56]
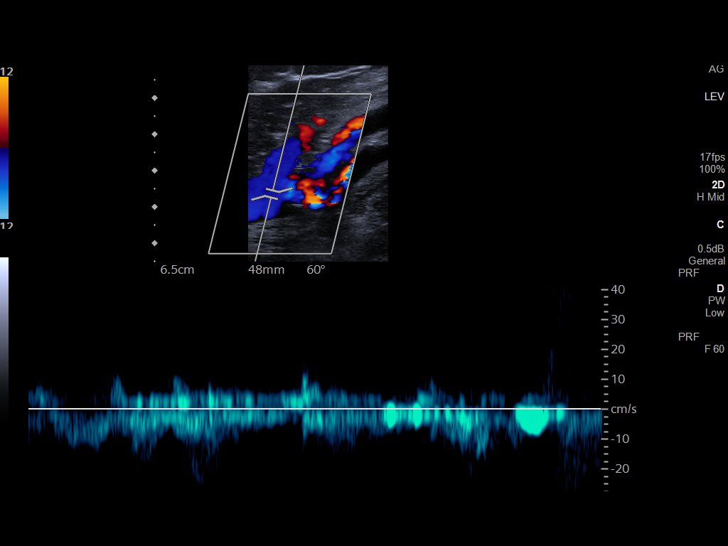
[im 36/56]
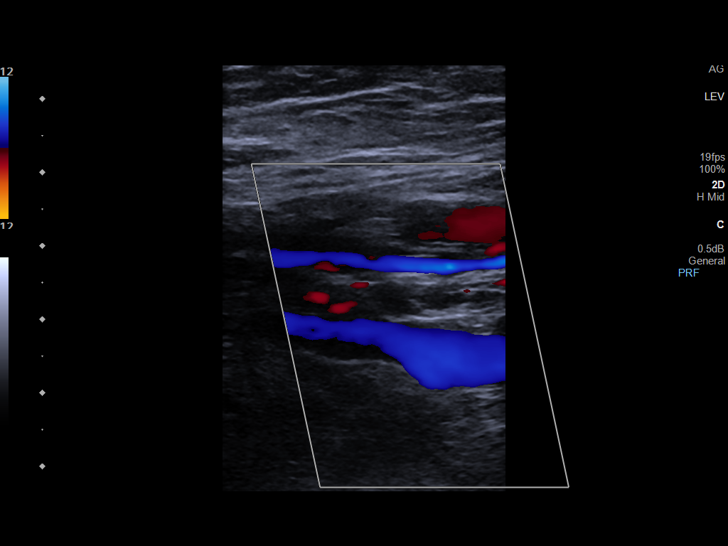
[im 41/56]
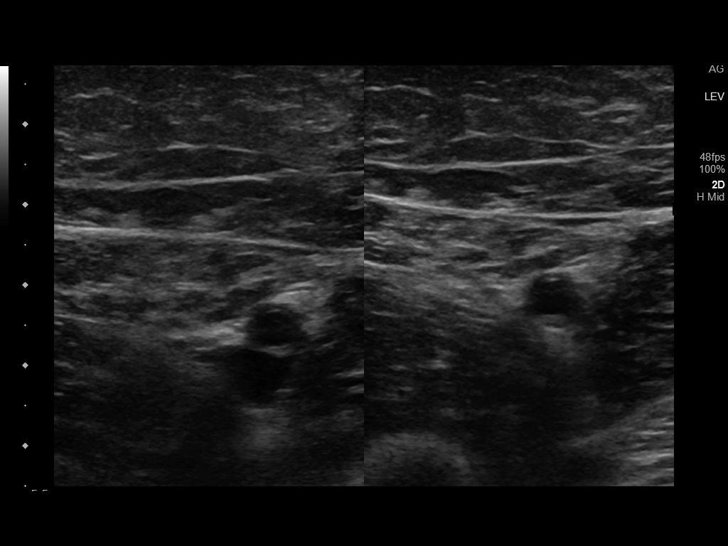
[im 46/56]
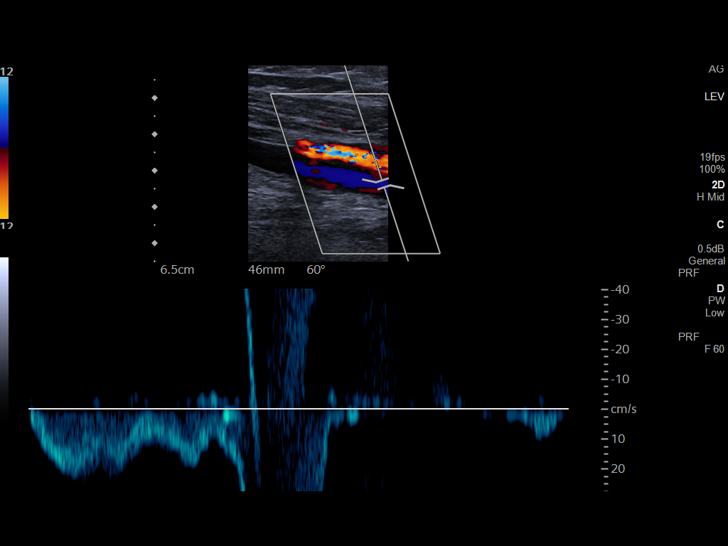
[im 51/56]
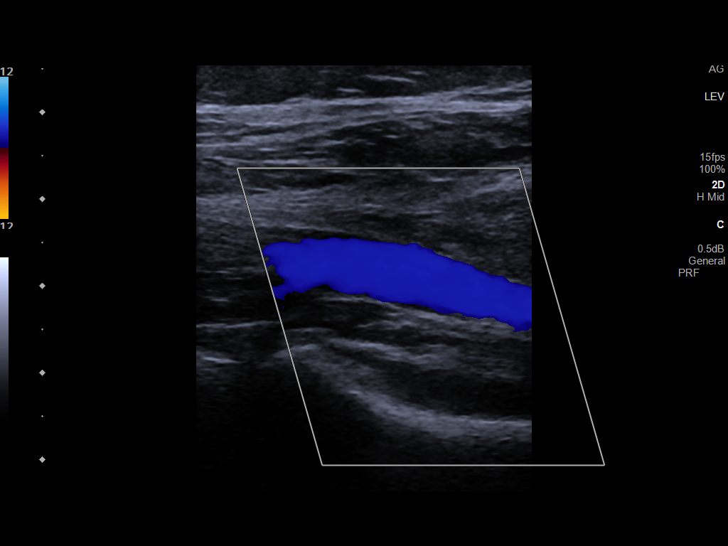
[im 56/56]
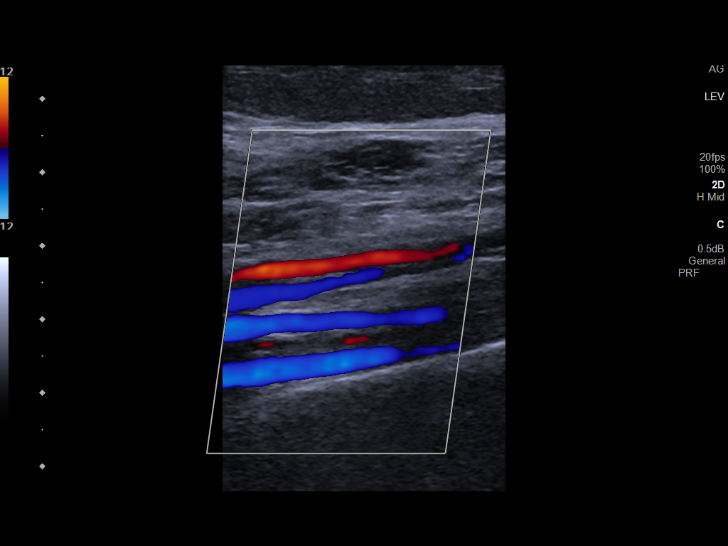

[13 of 24 positions shown; findings below may reference images not displayed]

FINDINGS: RIGHT LOWER EXTREMITY

Common Femoral Vein: No evidence of thrombus. Normal
compressibility, respiratory phasicity and response to augmentation.

Saphenofemoral Junction: No evidence of thrombus. Normal
compressibility and flow on color Doppler imaging.

Profunda Femoral Vein: No evidence of thrombus. Normal
compressibility and flow on color Doppler imaging.

Femoral Vein: No evidence of thrombus. Normal compressibility,
respiratory phasicity and response to augmentation.

Popliteal Vein: No evidence of thrombus. Normal compressibility,
respiratory phasicity and response to augmentation.

Calf Veins: No evidence of thrombus. Normal compressibility and flow
on color Doppler imaging.

Superficial Great Saphenous Vein: No evidence of thrombus. Normal
compressibility.

Venous Reflux:  None.

Other Findings:  None.

LEFT LOWER EXTREMITY

Common Femoral Vein: No evidence of thrombus. Normal
compressibility, respiratory phasicity and response to augmentation.

Saphenofemoral Junction: No evidence of thrombus. Normal
compressibility and flow on color Doppler imaging.

Profunda Femoral Vein: No evidence of thrombus. Normal
compressibility and flow on color Doppler imaging.

Femoral Vein: No evidence of thrombus. Normal compressibility,
respiratory phasicity and response to augmentation.

Popliteal Vein: No evidence of thrombus. Normal compressibility,
respiratory phasicity and response to augmentation.

Calf Veins: No evidence of thrombus. Normal compressibility and flow
on color Doppler imaging.

Superficial Great Saphenous Vein: No evidence of thrombus. Normal
compressibility.

Venous Reflux:  None.

Other Findings:  None.
IMPRESSION: No evidence of deep venous thrombosis in either lower extremity.
